# Patient Record
Sex: Female | Born: 1990 | Race: Black or African American | Hispanic: No | Marital: Single
Health system: Southern US, Community
[De-identification: ages and names within clinical notes are randomized; demographics above are authoritative.]

## PROBLEM LIST (undated history)

## (undated) DIAGNOSIS — G43909 Migraine, unspecified, not intractable, without status migrainosus: Secondary | ICD-10-CM

## (undated) DIAGNOSIS — F329 Major depressive disorder, single episode, unspecified: Secondary | ICD-10-CM

## (undated) DIAGNOSIS — F32A Depression, unspecified: Secondary | ICD-10-CM

## (undated) DIAGNOSIS — G35 Multiple sclerosis: Secondary | ICD-10-CM

## (undated) HISTORY — PX: NO PAST SURGERIES: SHX2092

## (undated) HISTORY — DX: Multiple sclerosis: G35

---
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
## 2012-01-24 ENCOUNTER — Encounter (HOSPITAL_COMMUNITY): Payer: Self-pay | Admitting: *Deleted

## 2012-01-24 ENCOUNTER — Emergency Department (HOSPITAL_COMMUNITY)
Admission: EM | Admit: 2012-01-24 | Discharge: 2012-01-25 | Disposition: A | Discharge status: Home / Self Care | Payer: Medicaid Other | Attending: Emergency Medicine | Admitting: Emergency Medicine

## 2012-01-24 DIAGNOSIS — F329 Major depressive disorder, single episode, unspecified: Secondary | ICD-10-CM | POA: Insufficient documentation

## 2012-01-24 DIAGNOSIS — F3289 Other specified depressive episodes: Secondary | ICD-10-CM | POA: Insufficient documentation

## 2012-01-24 DIAGNOSIS — E669 Obesity, unspecified: Secondary | ICD-10-CM | POA: Insufficient documentation

## 2012-01-24 HISTORY — DX: Migraine, unspecified, not intractable, without status migrainosus: G43.909

## 2012-01-24 HISTORY — DX: Depression, unspecified: F32.A

## 2012-01-24 HISTORY — DX: Major depressive disorder, single episode, unspecified: F32.9

## 2012-01-24 LAB — DIFFERENTIAL
Lymphocytes Relative: 17 % (ref 12–46)
Lymphs Abs: 2.2 10*3/uL (ref 0.7–4.0)
Monocytes Absolute: 0.5 10*3/uL (ref 0.1–1.0)
Monocytes Relative: 4 % (ref 3–12)
Neutro Abs: 9.5 10*3/uL — ABNORMAL HIGH (ref 1.7–7.7)
Neutrophils Relative %: 77 % (ref 43–77)

## 2012-01-24 LAB — CBC
HCT: 38.8 % (ref 36.0–46.0)
Hemoglobin: 13.1 g/dL (ref 12.0–15.0)
RBC: 4.51 MIL/uL (ref 3.87–5.11)

## 2012-01-24 LAB — BASIC METABOLIC PANEL
BUN: 7 mg/dL (ref 6–23)
CO2: 22 mEq/L (ref 19–32)
Chloride: 103 mEq/L (ref 96–112)
GFR calc Af Amer: >90 mL/min (ref >90)
Glucose, Bld: 198 mg/dL — ABNORMAL HIGH (ref 70–99)
Potassium: 3.3 mEq/L — ABNORMAL LOW (ref 3.5–5.1)

## 2012-01-24 LAB — URINALYSIS, ROUTINE W REFLEX MICROSCOPIC
Bilirubin Urine: NEGATIVE
Ketones, ur: NEGATIVE mg/dL
Leukocytes, UA: NEGATIVE
Nitrite: NEGATIVE
Specific Gravity, Urine: >1.03 — ABNORMAL HIGH (ref 1.005–1.030)
Urobilinogen, UA: 0.2 mg/dL (ref 0.0–1.0)
pH: 5.5 (ref 5.0–8.0)

## 2012-01-24 LAB — SALICYLATE LEVEL: Salicylate Lvl: 0 mg/dL — ABNORMAL LOW (ref 2.8–20.0)

## 2012-01-24 LAB — HEPATIC FUNCTION PANEL
ALT: 17 U/L (ref 0–35)
Albumin: 4 g/dL (ref 3.5–5.2)
Total Protein: 8.1 g/dL (ref 6.0–8.3)

## 2012-01-24 NOTE — ED Notes (Signed)
Pt ok w/ family coming back. Security wanded family before coming back to the er.

## 2012-01-24 NOTE — ED Notes (Signed)
Pt reports threating to take pills. Pt did have a suicide attempt in April of last year.
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
## 2012-01-24 NOTE — ED Notes (Signed)
Per EMS pt in argument w/ mother. Threatened to take some pills.

## 2012-01-24 NOTE — ED Provider Notes (Signed)
History    This chart was scribed for Jacqueline Octave, MD, MD by Jacqueline Erickson. The patient was seen in room APA16A and the patient's care was started at 10:48PM.   CSN: [id number]  Arrival date & time 01/24/12  2216   First MD Initiated Contact with Patient 01/24/12 2235      Chief Complaint  Patient presents with  . V70.1    (Consider location/radiation/quality/duration/timing/severity/associated sxs/prior treatment) The history is provided by the patient.   Jacqueline Erickson is a 21 y.o. female who presents to the Emergency Department due to mom thinking pt had taken ibuprofen today. Pt has argument with mom and threatened to take some pills but did not take any. Denies SI and HI. Mom called the police and was going to take out IVC papers out but pt decided to come to ED instead. Reports hx of depression. Denies any other illnesses and pain.   Past Medical History  Diagnosis Date  . Depression   . Migraine     History reviewed. No pertinent past surgical history.  History reviewed. No pertinent family history.  History  Substance Use Topics  . Smoking status: Never Smoker   . Smokeless tobacco: Not on file  . Alcohol Use: No    OB History    Grav Para Term Preterm Abortions TAB SAB Ect Mult Living                  Review of Systems  All other systems reviewed and are negative.  10 Systems reviewed and all are negative for acute change except as noted in the HPI.   Allergies  Histamine and Watermelon flavor  Home Medications   Current Outpatient Rx  Name Route Sig Dispense Refill  . ACETAMINOPHEN 500 MG PO TABS Oral Take 500 mg by mouth as needed. For pain    . ETONOGESTREL 68 MG [place] IMPL Subcutaneous Inject 1 each into the skin once.      BP 118/95  Pulse 115  Temp(Src) 99.8 F (37.7 C) (Oral)  Resp 20  Ht 5\' 2"  (1.575 m)  Wt 230 lb (104.327 kg)  BMI 42.07 kg/m2  SpO2 100%  LMP 12/11/2011  Physical Exam  Nursing note and vitals  reviewed. Constitutional: She is oriented to person, place, and time. She appears well-developed and well-nourished. No distress.       Tearful  obese  HENT:  Head: Normocephalic and atraumatic.  Eyes: Conjunctivae are normal. Pupils are equal, round, and reactive to light.  Cardiovascular: Normal rate and regular rhythm.   Pulmonary/Chest: Effort normal and breath sounds normal. No respiratory distress.  Abdominal: Soft. She exhibits no distension.  Neurological: She is alert and oriented to person, place, and time.  Skin: Skin is warm and dry.  Psychiatric: She has a normal mood and affect. Her behavior is normal.    ED Course  Procedures (including critical care time) DIAGNOSTIC STUDIES: Oxygen Saturation is 100% on room air, normal by my interpretation.    COORDINATION OF CARE: 10:51PM EDP discusses pt ED treatment with pt.    Labs Reviewed  URINALYSIS, ROUTINE W REFLEX MICROSCOPIC - Abnormal; Notable for the following:    Specific Gravity, Urine >1.030 (*)    Protein, ur TRACE (*)    All other components within normal limits  CBC - Abnormal; Notable for the following:    WBC 12.4 (*)    All other components within normal limits  DIFFERENTIAL - Abnormal; Notable for the following:  Neutro Abs 9.5 (*)    All other components within normal limits  BASIC METABOLIC PANEL - Abnormal; Notable for the following:    Potassium 3.3 (*)    Glucose, Bld 198 (*)    GFR calc non Af Amer 88 (*)    All other components within normal limits  SALICYLATE LEVEL - Abnormal; Notable for the following:    Salicylate Lvl 0.0 (*)    All other components within normal limits  PREGNANCY, URINE  ACETAMINOPHEN LEVEL  ETHANOL  HEPATIC FUNCTION PANEL  URINE RAPID DRUG SCREEN (HOSP PERFORMED)  URINE MICROSCOPIC-ADD ON   No results found.   1. Depression       MDM  Argument at home with mother. Patient threatened to take some pills including ibuprofen but states she did not. Still  feeling suicidal. Does not have plan to hurt herself. Does not want to hurt anyone else. Denies drug or alcohol abuse.  Screening labs, discuss with act team  Patient's mother has arrived as well as brother. Brother called the ambulance because he thought the patient took pills. Patient adamantly denies taking pills. She's no longer states she is suicidal. He denies any homicidal ideation or hallucinations. Patient admitted to behavioral health last year after intentional overdose. She regrets this and does not want to go back. No evidence of acute ingestion today.  She has contracted for safety with Jacqueline Erickson from act team. Her mother is comfortable taking her home. [facility]  I personally performed the services described in this documentation, which was scribed in my presence.  The recorded information has been reviewed and considered.        Jacqueline Octave, MD 01/25/12 [phone]

## 2012-01-25 LAB — RAPID URINE DRUG SCREEN, HOSP PERFORMED
Barbiturates: NOT DETECTED
Tetrahydrocannabinol: NOT DETECTED

## 2012-01-25 MED ORDER — ACETAMINOPHEN 325 MG PO TABS
650.0000 mg | ORAL_TABLET | Freq: Once | ORAL | Status: AC
  Administered 2012-01-25: 650 mg via ORAL
  Filled 2012-01-25: qty 2

## 2012-01-25 NOTE — Discharge Instructions (Signed)
Depression, Adolescent and Adult Follow up with [facility] as instructed.  Return to the ED if you develop new or worsening symptoms. Depression is a true and treatable medical condition. In general there are two kinds of depression:  Depression we all experience in some form. For example depression from the death of a loved one, financial distress or natural disasters will trigger or increase depression.   Clinical depression, on the other hand, appears without an apparent cause or reason. This depression is a disease. Depression may be caused by chemical imbalance in the body and brain or may come as a response to a physical illness. Alcohol and other drugs can cause depression.  DIAGNOSIS  The diagnosis of depression is usually based upon symptoms and medical history. TREATMENT  Treatments for depression fall into three categories. These are:  Drug therapy. There are many medicines that treat depression. Responses may vary and sometimes trial and error is necessary to determine the best medicines and dosage for a particular patient.   Psychotherapy, also called talking treatments, helps people resolve their problems by looking at them from a different point of view and by giving people insight into their own personal makeup. Traditional psychotherapy looks at a childhood source of a problem. Other psychotherapy will look at current conflicts and move toward solving those. If the cause of depression is drug use, counseling is available to help abstain. In time the depression will usually improve. If there were underlying causes for the chemical use, they can be addressed.   ECT (electroconvulsive therapy) or shock treatment is not as commonly used today. It is a very effective treatment for severe suicidal depression. During ECT electrical impulses are applied to the head. These impulses cause a generalized seizure. It can be effective but causes a loss of memory for recent events. Sometimes this  loss of memory may include the last several months.  Treat all depression or suicide threats as serious. Obtain professional help. Do not wait to see if serious depression will get better over time without help. Seek help for yourself or those around you. In the [place] the number to the National Suicide Help Lines With 24 Hour Help Are: 1-800-SUICIDE 507-439-4744 Document Released: 08/10/2000 Document Revised: 08/02/2011 Document Reviewed: 03/31/2008 [facility] Patient Information 2012 [place], [place].

## 2012-01-25 NOTE — ED Notes (Signed)
Pt alert & oriented x4, stable gait. Pt given discharge instructions, paperwork & prescription(s). Patient instructed to stop at the registration desk to finish any additional paperwork. pt verbalized understanding. Pt left department w/ no further questions.  
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
## 2013-04-30 ENCOUNTER — Emergency Department (INDEPENDENT_AMBULATORY_CARE_PROVIDER_SITE_OTHER)
Admission: EM | Admit: 2013-04-30 | Discharge: 2013-04-30 | Disposition: A | Discharge status: Home / Self Care | Payer: BC Managed Care – PPO | Source: Home / Self Care | Attending: Family Medicine | Admitting: Family Medicine

## 2013-04-30 ENCOUNTER — Encounter (HOSPITAL_COMMUNITY): Payer: Self-pay | Admitting: Emergency Medicine

## 2013-04-30 DIAGNOSIS — L089 Local infection of the skin and subcutaneous tissue, unspecified: Secondary | ICD-10-CM

## 2013-04-30 MED ORDER — DOXYCYCLINE HYCLATE 100 MG PO CAPS: 100.0000 mg | ORAL_CAPSULE | Freq: Two times a day (BID) | ORAL | Status: DC

## 2013-04-30 NOTE — ED Provider Notes (Signed)
Jacqueline Erickson is a 22 y.o. female who presents to Urgent Care today for her fourth fingernail injury. On Monday patient's right fourth fingernail is dorsally forced upwards. She notes pain and some discharge from underneath the nail currently. She describes the discharge is clear to yellow. She has pain with palpation of the digit but otherwise feels well. She denies any fevers chills nausea vomiting diarrhea trouble breathing. She has tried soaking it in peroxide and using antibiotic ointment which has helped a bit. She feels well otherwise   Past Medical History  Diagnosis Date  . Depression   . Migraine    History  Substance Use Topics  . Smoking status: Never Smoker   . Smokeless tobacco: Not on file  . Alcohol Use: No   ROS as above Medications reviewed. No current facility-administered medications for this encounter.   Current Outpatient Prescriptions  Medication Sig Dispense Refill  . acetaminophen (TYLENOL) 500 MG tablet Take 500 mg by mouth as needed. For pain      . doxycycline (VIBRAMYCIN) 100 MG capsule Take 1 capsule (100 mg total) by mouth 2 (two) times daily.  14 capsule  0  . etonogestrel (NEXPLANON) 68 MG IMPL implant Inject 1 each into the skin once.        Exam:  BP 136/85  Pulse 98  Temp(Src) 99.4 F (37.4 C) (Oral)  Resp 20  SpO2 100%  LMP 04/12/2013 Gen: Well NAD HAND: Right fourth fingernail. Artificial nail applied to the natural fingernail. The natural nail is dorsally displaced mildly. There is some clear discharge present. Mildly tender to touch. No surrounding erythema. Sensation is intact as is capillary refill distally.   No results found for this or any previous visit (from the past 24 hour(s)). No results found.  Assessment and Plan: 22 y.o. female with possible infection of a slightly displaced fingernail.  Plan to treat with warm water soaks and doxycycline.  Watchful waiting Followup as needed Discussed warning signs or symptoms.  Please see discharge instructions. Patient expresses understanding.      Rodolph Bong, MD 04/30/13 [phone]

## 2013-04-30 NOTE — ED Notes (Signed)
C/o right ring finger injury due to snag the finger on bed rail on Monday.   Patient did soak finger in peroxide and used triple antibiotic ointment.  Yellowish cloudy Pus started today.

## 2013-06-04 ENCOUNTER — Emergency Department (HOSPITAL_COMMUNITY)
Admission: EM | Admit: 2013-06-04 | Discharge: 2013-06-04 | Disposition: A | Discharge status: Home / Self Care | Payer: BC Managed Care – PPO | Attending: Emergency Medicine | Admitting: Emergency Medicine

## 2013-06-04 ENCOUNTER — Encounter (HOSPITAL_COMMUNITY): Payer: Self-pay | Admitting: Emergency Medicine

## 2013-06-04 ENCOUNTER — Emergency Department (HOSPITAL_COMMUNITY): Payer: BC Managed Care – PPO

## 2013-06-04 DIAGNOSIS — Z8659 Personal history of other mental and behavioral disorders: Secondary | ICD-10-CM | POA: Insufficient documentation

## 2013-06-04 DIAGNOSIS — Z8679 Personal history of other diseases of the circulatory system: Secondary | ICD-10-CM | POA: Insufficient documentation

## 2013-06-04 DIAGNOSIS — M25562 Pain in left knee: Secondary | ICD-10-CM

## 2013-06-04 DIAGNOSIS — Z79899 Other long term (current) drug therapy: Secondary | ICD-10-CM | POA: Insufficient documentation

## 2013-06-04 DIAGNOSIS — M25569 Pain in unspecified knee: Secondary | ICD-10-CM | POA: Insufficient documentation

## 2013-06-04 NOTE — ED Notes (Signed)
Patient transported to X-ray 

## 2013-06-04 NOTE — ED Notes (Signed)
Pt. Reports left knee pain for 2 weeks denies injury or fall ,ambulatory.

## 2013-06-04 NOTE — ED Provider Notes (Signed)
CSN: [id number]     Arrival date & time 06/04/13  1709 History  This chart was scribed for non-physician practitioner Coral Ceo, PA-C, working with Richardean Canal, MD by Dorothey Baseman, ED Scribe. This patient was seen in room TR08C/TR08C and the patient's care was started at 7:11 PM.    Chief Complaint  Patient presents with  . Knee Pain   The history is provided by the patient. No language interpreter was used.   HPI Comments: Jacqueline Erickson is a 22 y.o. Female with a PMH of migraine and depression who presents to the Emergency Department complaining of knee pain.  Patient states she has had left knee pain for the past 2 weeks. She states that her pain is intermittent and is described as an aching sensation. She states that her pain is worse when she wakes up in the morning and after standing or sitting for long periods of time. She states that her pain is also worse when she walks. She denies any injuries, trauma, or falls. Patient states that she took naproxen at home which were early relieves her pain however it returns. He denies any similar knee pain or previous knee surgeries in the past. Patient states that she recently began a new job where she standing for several hours at a time. Denies any knee swelling, erythema, ecchymosis, or lacerations. She denies any numbness, tingling, loss of sensation, or weakness.  He otherwise has been well with no fever, chills, change in appetite or activity, chest pain, shortness of breath, abdominal pain, nausea, vomiting, dysuria, leg edema, calf tenderness or swelling. She denies any history of DVT or clotting in the past. She denies any recent immobilization, recent travel, history of cancer.    Past Medical History  Diagnosis Date  . Depression   . Migraine    History reviewed. No pertinent past surgical history. No family history on file. History  Substance Use Topics  . Smoking status: Never Smoker   . Smokeless tobacco: Not on file  .  Alcohol Use: No   OB History   Grav Para Term Preterm Abortions TAB SAB Ect Mult Living                 Review of Systems  Constitutional: Negative for fever, chills, diaphoresis, activity change, appetite change and fatigue.  HENT: Negative for congestion and sore throat.   Eyes: Negative for visual disturbance.  Respiratory: Negative for cough and shortness of breath.   Cardiovascular: Negative for chest pain and leg swelling.  Gastrointestinal: Negative for nausea, vomiting, abdominal pain, diarrhea and constipation.  Genitourinary: Negative for dysuria.  Musculoskeletal: Positive for arthralgias. Negative for back pain, gait problem, joint swelling and myalgias.  Skin: Negative for color change and wound.  Neurological: Negative for dizziness, weakness, light-headedness, numbness and headaches.    Allergies  Garlic powder; Histamine; and Watermelon flavor  Home Medications   Current Outpatient Rx  Name  Route  Sig  Dispense  Refill  . naproxen (NAPROSYN) 500 MG tablet   Oral   Take 500 mg by mouth 2 (two) times daily as needed (pain).         Marland Kitchen etonogestrel (NEXPLANON) 68 MG IMPL implant   Subcutaneous   Inject 1 each into the skin once.          Triage Vitals: BP 156/87  Pulse 78  Temp(Src) 98.6 F (37 C) (Oral)  Resp 18  Wt 240 lb (108.863 kg)  BMI 43.89 kg/m2  SpO2 98%  Filed Vitals:   06/04/13 1718 06/04/13 2031  BP: 156/87 129/92  Pulse: 78 84  Temp: 98.6 F (37 C)   TempSrc: Oral   Resp: 18 15  Weight: 240 lb (108.863 kg)   SpO2: 98% 100%     Physical Exam  Nursing note and vitals reviewed. Constitutional: She is oriented to person, place, and time. She appears well-developed and well-nourished. No distress.  HENT:  Head: Normocephalic and atraumatic.  Eyes: Conjunctivae are normal.  Neck: Normal range of motion. Neck supple.  Cardiovascular: Normal rate, regular rhythm, normal heart sounds and intact distal pulses.  Exam reveals no  gallop and no friction rub.   No murmur heard. Dorsalis pedis pulses present bilaterally  Pulmonary/Chest: Effort normal and breath sounds normal. No respiratory distress. She has no wheezes. She has no rales. She exhibits no tenderness.  Abdominal: Soft. She exhibits no distension. There is no tenderness.  Musculoskeletal: Normal range of motion. She exhibits tenderness. She exhibits no edema.  Tenderness to palpation to the left anterior inferior joint lines of the patella as well as supra-patellar tenderness. Increased pain with flexion and extension of the left knee no limitations of range of motion. There is no evidence of edema or erythema to the knees bilaterally. Patella is without tenderness or mobility. No pedal edema or calf tenderness bilaterally. No left ankle or foot tenderness to palpation. No left hip tenderness to palpation. Patient able to ambulate without difficulty or ataxia. Patient able to flex and extend left hip without difficulty or limitations. Strength 5 out of 5 in lower extremity bilaterally  Neurological: She is alert and oriented to person, place, and time.  Gross sensation intact in the lower extremities. Patellar reflexes intact bilaterally  Skin: Skin is warm and dry. She is not diaphoretic. No erythema.     No ecchymosis, edema, erythema, or lacerations to the left knee throughout  Psychiatric: She has a normal mood and affect. Her behavior is normal.    ED Course  Procedures (including critical care time)  DIAGNOSTIC STUDIES: Oxygen Saturation is 98% on room air, normal by my interpretation.    COORDINATION OF CARE: 7:15PM- Discussed negative x-ray results. Discussed that suspicion for infection is low and that symptoms are likely musculoskeletal in nature. Advised patient to continue taking Naproxen at home, to apply cold compresses to the area, and to stretch the surrounding muscles. Advised patient to follow up with a PCP or orthopaedist, especially if  there are any new or worsening symptoms. Will discharge patient with a knee brace at her request. Discussed treatment plan with patient at bedside and patient verbalized agreement.     Labs Review Labs Reviewed - No data to display  Imaging Review Dg Knee Complete 4 Views Left  06/04/2013   CLINICAL DATA:  Two week history of left knee pain  EXAM: LEFT KNEE - COMPLETE 4+ VIEW  COMPARISON:  None.  FINDINGS: There is no evidence of fracture, dislocation, or joint effusion. There is no evidence of arthropathy or other focal bone abnormality. Soft tissues are unremarkable.  IMPRESSION: Negative.   Electronically Signed   By: Malachy Moan M.D.   On: 06/04/2013 18:40    EKG Interpretation   None       DG Knee Complete 4 Views Left (Final result)  Result time: 06/04/13 18:40:35    Final result by Rad Results In Interface (06/04/13 18:40:35)    Narrative:   CLINICAL DATA: Two week history of left  knee pain  EXAM: LEFT KNEE - COMPLETE 4+ VIEW  COMPARISON: None.  FINDINGS: There is no evidence of fracture, dislocation, or joint effusion. There is no evidence of arthropathy or other focal bone abnormality. Soft tissues are unremarkable.  IMPRESSION: Negative.   Electronically Signed By: Malachy Moan M.D. On: 06/04/2013 18:40         MDM   1. Left knee pain     Jacqueline Erickson is a 22 y.o. Female with a PMH of migraine and depression who presents to the Emergency Department complaining of knee pain.  Left knee x-rays ordered.    Etiology of left knee pain is unclear however is likely musculoskeletal in nature.  Patient x-rays were negative for fracture or dislocation. She had no signs of infection including edema, erythema, or fever.  Patient was neurovascularly intact no limitations of range of motion.  Wells score negative. PERC negative. No leg edema or calf tenderness.  She was given a knee sleeve to help with symptomatic relief. She was also instructed to  rest, elevate, and apply cold compresses for relief.  She was instructed to followup with her primary care provider if her symptoms are not improving or worsening. He was also given referral to orthopedics to make an appointment if her symptoms are not improving or worsening. Patient was instructed to return to the ED if they experience any loss of sensation, weakness, joint swelling or redness, fever, unilateral leg swelling or redness, or other concerns. Patient was in agreement with discharge and plan.     Final impressions: 1. Left knee pain      Jacqueline Erickson   I personally performed the services described in this documentation, which was scribed in my presence. The recorded information has been reviewed and is accurate.     Jillyn Ledger, PA-C 06/05/13 [phone]

## 2013-06-05 NOTE — ED Provider Notes (Signed)
Medical screening examination/treatment/procedure(s) were performed by non-physician practitioner and as supervising physician I was immediately available for consultation/collaboration.   David H Yao, MD 06/05/13 1328 

## 2013-06-19 ENCOUNTER — Ambulatory Visit (INDEPENDENT_AMBULATORY_CARE_PROVIDER_SITE_OTHER): Payer: BC Managed Care – PPO | Admitting: Obstetrics & Gynecology

## 2013-06-19 ENCOUNTER — Encounter: Payer: Medicaid Other | Admitting: Obstetrics & Gynecology

## 2013-06-19 ENCOUNTER — Encounter: Payer: Self-pay | Admitting: Obstetrics & Gynecology

## 2013-06-19 VITALS — BP 120/80 | HR 89 | Temp 97.6°F | Ht 61.5 in | Wt 238.1 lb

## 2013-06-19 DIAGNOSIS — N898 Other specified noninflammatory disorders of vagina: Secondary | ICD-10-CM

## 2013-06-19 DIAGNOSIS — Z01419 Encounter for gynecological examination (general) (routine) without abnormal findings: Secondary | ICD-10-CM

## 2013-06-19 NOTE — Progress Notes (Signed)
Patient ID: Jacqueline Erickson, female   DOB: [birth year], 22 y.o.   MRN: [id number] Subjective:     Jacqueline Erickson is a 22 y.o. female here for a routine exam.  Pt has had Nexplanon for 2 year with no problems. H/o trich 3 years prev. Monogamous for 2 years. She does report a thick discharge but, no itching or odor.  Gynecologic History Patient's last menstrual period was 04/11/2013. Contraception: Nexplanon  Obstetric History OB History  Gravida Para Term Preterm AB SAB TAB Ectopic Multiple Living  0 0 0 0 0 0 0 0 0 0          The following portions of the patient's history were reviewed and updated as appropriate: allergies, current medications, past family history, past medical history, past social history, past surgical history and problem list.  Review of Systems Pertinent items are noted in HPI.    Objective:    BP 120/80  Pulse 89  Temp(Src) 97.6 F (36.4 C)  Ht 5' 1.5" (1.562 m)  Wt 238 lb 1.6 oz (108.001 kg)  BMI 44.27 kg/m2  LMP 04/11/2013  General Appearance:    Alert, cooperative, no distress, appears stated age              Throat:   Lips, mucosa, and tongue normal; teeth and gums normal  Neck:   Supple, symmetrical, trachea midline, no adenopathy;    thyroid:  no enlargement/tenderness/nodules; no carotid   bruit or JVD  Back:     Symmetric, no curvature, ROM normal, no CVA tenderness  Lungs:     Clear to auscultation bilaterally, respirations unlabored  Chest Wall:    No tenderness or deformity   Heart:    Regular rate and rhythm, S1 and S2 normal, no murmur, rub   or gallop  Breast Exam:    No tenderness, masses, or nipple abnormality  Abdomen:     Soft, non-tender, bowel sounds active all four quadrants,    no masses, no organomegaly  Genitalia:    Normal female without lesion or tenderness; there is a thick white discharge small amount     Extremities:   Extremities normal, atraumatic, no cyanosis or edema  Pulses:   2+ and symmetric all  extremities  Skin:   Skin color, texture, turgor normal, no rashes or lesions  Lymph nodes:   Cervical, supraclavicular, and axillary nodes normal         Assessment:    Healthy female exam.  Vaginal discharge- only min sx   Plan:    Follow up in: 1 year.   F/u [facility] [place]  F/u cx

## 2013-06-19 NOTE — Patient Instructions (Signed)
Sexually Transmitted Disease  Sexually transmitted disease (STD) refers to any infection that is passed from person to person during sexual activity. This may happen by way of saliva, semen, blood, vaginal mucus, or urine. Common STDs include:   Gonorrhea.   Chlamydia.   Syphilis.   HIV/AIDS.   Genital herpes.   Hepatitis B and C.   Trichomonas.   Human papillomavirus (HPV).   Pubic lice.  CAUSES   An STD may be spread by bacteria, virus, or parasite. A person can get an STD by:   Sexual intercourse with an infected person.   Sharing sex toys with an infected person.   Sharing needles with an infected person.   Having intimate contact with the genitals, mouth, or rectal areas of an infected person.  SYMPTOMS   Some people may not have any symptoms, but they can still pass the infection to others. Different STDs have different symptoms. Symptoms include:   Painful or bloody urination.   Pain in the pelvis, abdomen, vagina, anus, throat, or eyes.   Skin rash, itching, irritation, growths, or sores (lesions). These usually occur in the genital or anal area.   Abnormal vaginal discharge.   Penile discharge in men.   Soft, flesh-colored skin growths in the genital or anal area.   Fever.   Pain or bleeding during sexual intercourse.   Swollen glands in the groin area.   Yellow skin and eyes (jaundice). This is seen with hepatitis.  DIAGNOSIS   To make a diagnosis, your caregiver may:   Take a medical history.   Perform a physical exam.   Take a specimen (culture) to be examined.   Examine a sample of discharge under a microscope.   Perform blood tests.   Perform a Pap test, if this applies.   Perform a colposcopy.   Perform a laparoscopy.  TREATMENT    Chlamydia, gonorrhea, trichomonas, and syphilis can be cured with antibiotic medicine.   Genital herpes, hepatitis, and HIV can be treated, but not cured, with prescribed medicines. The medicines will lessen the symptoms.   Genital warts  from HPV can be treated with medicine or by freezing, burning (electrocautery), or surgery. Warts may come back.   HPV is a virus and cannot be cured with medicine or surgery.However, abnormal areas may be followed very closely by your caregiver and may be removed from the cervix, vagina, or vulva through office procedures or surgery.  If your diagnosis is confirmed, your recent sexual partners need treatment. This is true even if they are symptom-free or have a negative culture or evaluation. They should not have sex until their caregiver says it is okay.  HOME CARE INSTRUCTIONS   All sexual partners should be informed, tested, and treated for all STDs.   Take your antibiotics as directed. Finish them even if you start to feel better.   Only take over-the-counter or prescription medicines for pain, discomfort, or fever as directed by your caregiver.   Rest.   Eat a balanced diet and drink enough fluids to keep your urine clear or pale yellow.   Do not have sex until treatment is completed and you have followed up with your caregiver. STDs should be checked after treatment.   Keep all follow-up appointments, Pap tests, and blood tests as directed by your caregiver.   Only use latex condoms and water-soluble lubricants during sexual activity. Do not use petroleum jelly or oils.   Avoid alcohol and illegal drugs.   Get vaccinated   for HPV and hepatitis. If you have not received these vaccines in the past, talk to your caregiver about whether one or both might be right for you.   Avoid risky sex practices that can break the skin.  The only way to avoid getting an STD is to avoid all sexual activity.Latex condoms and dental dams (for oral sex) will help lessen the risk of getting an STD, but will not completely eliminate the risk.  SEEK MEDICAL CARE IF:    You have a fever.   You have any new or worsening symptoms.  Document Released: 11/03/2002 Document Revised: 11/05/2011 Document Reviewed:  11/10/2010  [employer] Patient Information 2014 [employer].

## 2013-06-19 NOTE — Addendum Note (Signed)
Addended by: Franchot Mimes on: 06/19/2013 12:14 PM   Modules accepted: Orders

## 2013-06-20 LAB — WET PREP, GENITAL
Trich, Wet Prep: NONE SEEN
Yeast Wet Prep HPF POC: NONE SEEN

## 2013-07-10 ENCOUNTER — Ambulatory Visit (INDEPENDENT_AMBULATORY_CARE_PROVIDER_SITE_OTHER): Payer: BC Managed Care – PPO | Admitting: Obstetrics & Gynecology

## 2013-07-10 ENCOUNTER — Encounter: Payer: Self-pay | Admitting: Obstetrics & Gynecology

## 2013-07-10 VITALS — BP 113/80 | HR 87 | Temp 99.0°F | Ht 62.0 in | Wt 234.8 lb

## 2013-07-10 DIAGNOSIS — N760 Acute vaginitis: Secondary | ICD-10-CM

## 2013-07-10 MED ORDER — FLUCONAZOLE 150 MG PO TABS: 150.0000 mg | ORAL_TABLET | Freq: Once | ORAL | Status: DC

## 2013-07-10 NOTE — Progress Notes (Signed)
Subjective:     Patient ID: Jacqueline Erickson, female   DOB: 1991-04-25, 21 y.o.   MRN: [id number]  HPI Pt reports that she started having itching and a brown discharge so she took some meds at  Home that her mother had (she called and it was Metrogel).  It has not helped with the sx.    Review of Systems     Objective:   Physical Exam BP 113/80  Pulse 87  Temp(Src) 99 F (37.2 C)  Ht 5\' 2"  (1.575 m)  Wt 234 lb 12.8 oz (106.505 kg)  BMI 42.93 kg/m2  LMP 04/11/2013 GU: EGBUS: no lesions Vagina: no blood in vault; thick white curd like discharge Cervix: no lesion; no mucopurulent d/c      Assessment:     Vaginitis- suspect yeast     Plan:     F/u wet smear and KOH  Diflucan 150mg  q day x 1 F/u prn

## 2013-07-10 NOTE — Progress Notes (Signed)
Patient reports vaginal irritation with brownish discharge x1 week. Took a medicine that her mom had and irritation was relieved. Now discharge is coming back.

## 2013-07-11 LAB — WET PREP, GENITAL

## 2014-12-16 ENCOUNTER — Ambulatory Visit: Payer: Self-pay | Admitting: Obstetrics & Gynecology

## 2015-01-09 ENCOUNTER — Encounter (HOSPITAL_COMMUNITY): Payer: Self-pay | Admitting: Emergency Medicine

## 2015-01-09 ENCOUNTER — Emergency Department (INDEPENDENT_AMBULATORY_CARE_PROVIDER_SITE_OTHER)
Admission: EM | Admit: 2015-01-09 | Discharge: 2015-01-09 | Disposition: A | Discharge status: Home / Self Care | Payer: Self-pay | Source: Home / Self Care | Attending: Emergency Medicine | Admitting: Emergency Medicine

## 2015-01-09 DIAGNOSIS — M5442 Lumbago with sciatica, left side: Secondary | ICD-10-CM

## 2015-01-09 MED ORDER — IBUPROFEN 800 MG PO TABS: 800.0000 mg | ORAL_TABLET | Freq: Three times a day (TID) | ORAL | Status: DC

## 2015-01-09 MED ORDER — CYCLOBENZAPRINE HCL 10 MG PO TABS: 10.0000 mg | ORAL_TABLET | Freq: Three times a day (TID) | ORAL | Status: DC | PRN
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
## 2015-04-25 ENCOUNTER — Encounter (HOSPITAL_COMMUNITY): Payer: Self-pay | Admitting: Emergency Medicine

## 2015-04-25 ENCOUNTER — Emergency Department (INDEPENDENT_AMBULATORY_CARE_PROVIDER_SITE_OTHER)
Admission: EM | Admit: 2015-04-25 | Discharge: 2015-04-25 | Disposition: A | Discharge status: Home / Self Care | Payer: BLUE CROSS/BLUE SHIELD | Source: Home / Self Care | Attending: Family Medicine | Admitting: Family Medicine

## 2015-04-25 DIAGNOSIS — K2970 Gastritis, unspecified, without bleeding: Secondary | ICD-10-CM | POA: Diagnosis not present

## 2015-04-25 DIAGNOSIS — R1013 Epigastric pain: Secondary | ICD-10-CM

## 2015-04-25 DIAGNOSIS — G441 Vascular headache, not elsewhere classified: Secondary | ICD-10-CM | POA: Diagnosis not present

## 2015-04-25 MED ORDER — SUCRALFATE 1 G PO TABS: 1.0000 g | ORAL_TABLET | Freq: Three times a day (TID) | ORAL | Status: DC

## 2015-04-25 MED ORDER — ONDANSETRON HCL 4 MG PO TABS: 4.0000 mg | ORAL_TABLET | Freq: Four times a day (QID) | ORAL | Status: DC

## 2015-04-25 NOTE — Discharge Instructions (Signed)
Gastritis, Adult Zantac 150 mg twice a day Omeprazole 20 mg 1 or 2 times a day Carafate 1 gm before meals and bedtime. These tablets dissolve quickly in the mouth, so is easier to swallow. Try Lactaid for possible lactose intolerance zofran for nausea Gastritis is soreness and swelling (inflammation) of the lining of the stomach. Gastritis can develop as a sudden onset (acute) or long-term (chronic) condition. If gastritis is not treated, it can lead to stomach bleeding and ulcers. CAUSES  Gastritis occurs when the stomach lining is weak or damaged. Digestive juices from the stomach then inflame the weakened stomach lining. The stomach lining may be weak or damaged due to viral or bacterial infections. One common bacterial infection is the Helicobacter pylori infection. Gastritis can also result from excessive alcohol consumption, taking certain medicines, or having too much acid in the stomach.  SYMPTOMS  In some cases, there are no symptoms. When symptoms are present, they may include:  Pain or a burning sensation in the upper abdomen.  Nausea.  Vomiting.  An uncomfortable feeling of fullness after eating. DIAGNOSIS  Your caregiver may suspect you have gastritis based on your symptoms and a physical exam. To determine the cause of your gastritis, your caregiver may perform the following:  Blood or stool tests to check for the H pylori bacterium.  Gastroscopy. A thin, flexible tube (endoscope) is passed down the esophagus and into the stomach. The endoscope has a light and camera on the end. Your caregiver uses the endoscope to view the inside of the stomach.  Taking a tissue sample (biopsy) from the stomach to examine under a microscope. TREATMENT  Depending on the cause of your gastritis, medicines may be prescribed. If you have a bacterial infection, such as an H pylori infection, antibiotics may be given. If your gastritis is caused by too much acid in the stomach, H2 blockers or  antacids may be given. Your caregiver may recommend that you stop taking aspirin, ibuprofen, or other nonsteroidal anti-inflammatory drugs (NSAIDs). HOME CARE INSTRUCTIONS  Only take over-the-counter or prescription medicines as directed by your caregiver.  If you were given antibiotic medicines, take them as directed. Finish them even if you start to feel better.  Drink enough fluids to keep your urine clear or pale yellow.  Avoid foods and drinks that make your symptoms worse, such as:  Caffeine or alcoholic drinks.  Chocolate.  Peppermint or mint flavorings.  Garlic and onions.  Spicy foods.  Citrus fruits, such as oranges, lemons, or limes.  Tomato-based foods such as sauce, chili, salsa, and pizza.  Fried and fatty foods.  Eat small, frequent meals instead of large meals. SEEK IMMEDIATE MEDICAL CARE IF:   You have black or dark red stools.  You vomit blood or material that looks like coffee grounds.  You are unable to keep fluids down.  Your abdominal pain gets worse.  You have a fever.  You do not feel better after 1 week.  You have any other questions or concerns. MAKE SURE YOU:  Understand these instructions.  Will watch your condition.  Will get help right away if you are not doing well or get worse. Document Released: 08/07/2001 Document Revised: 02/12/2012 Document Reviewed: 09/26/2011 [facility] Patient Information 2015 [place], [place]. This information is not intended to replace advice given to you by your health care provider. Make sure you discuss any questions you have with your health care provider.  Abdominal Pain Many things can cause abdominal pain. Usually, abdominal pain  is not caused by a disease and will improve without treatment. It can often be observed and treated at home. Your health care provider will do a physical exam and possibly order blood tests and X-rays to help determine the seriousness of your pain. However, in many cases, more  time must pass before a clear cause of the pain can be found. Before that point, your health care provider may not know if you need more testing or further treatment. HOME CARE INSTRUCTIONS  Monitor your abdominal pain for any changes. The following actions may help to alleviate any discomfort you are experiencing:  Only take over-the-counter or prescription medicines as directed by your health care provider.  Do not take laxatives unless directed to do so by your health care provider.  Try a clear liquid diet (broth, tea, or water) as directed by your health care provider. Slowly move to a bland diet as tolerated. SEEK MEDICAL CARE IF:  You have unexplained abdominal pain.  You have abdominal pain associated with nausea or diarrhea.  You have pain when you urinate or have a bowel movement.  You experience abdominal pain that wakes you in the night.  You have abdominal pain that is worsened or improved by eating food.  You have abdominal pain that is worsened with eating fatty foods.  You have a fever. SEEK IMMEDIATE MEDICAL CARE IF:   Your pain does not go away within 2 hours.  You keep throwing up (vomiting).  Your pain is felt only in portions of the abdomen, such as the right side or the left lower portion of the abdomen.  You pass bloody or black tarry stools. MAKE SURE YOU:  Understand these instructions.   Will watch your condition.   Will get help right away if you are not doing well or get worse.  Document Released: 05/23/2005 Document Revised: 08/18/2013 Document Reviewed: 04/22/2013 [facility] Patient Information 2015 [place], [place]. This information is not intended to replace advice given to you by your health care provider. Make sure you discuss any questions you have with your health care provider.

## 2015-04-25 NOTE — ED Notes (Signed)
C/o constant epigastric pain onset 1 week associated w/HA, nauseas and diarrhea Pain increases w/"Dair food" Denies emesis, fevers, urinary sx Alert... No acute distress.

## 2015-04-25 NOTE — ED Provider Notes (Signed)
CSN: [id number]     Arrival date & time 04/25/15  1828 History   First MD Initiated Contact with Patient 04/25/15 1951     Chief Complaint  Patient presents with  . Abdominal Pain   (Consider location/radiation/quality/duration/timing/severity/associated sxs/prior Treatment) HPI Comments: 24 year old female presents with a history of epigastric pain for one week. It has become worse in the past 2-3 days. She has a history of migraine headaches in the epigastric pain have been triggering those. She is also had some loose stools. He epigastric pain is worse with dairy products. She occasionally has belching and reflux. She does have a more chronic history of occasional intermittent mild epigastric discomfort which has resolved on its own. She took ibuprofen and Pepto-Bismol without relief.  Patient is a 24 y.o. female presenting with abdominal pain.  Abdominal Pain Pain location:  Epigastric Pain quality: bloating, gnawing and sharp   Pain radiates to:  LUQ and RUQ Pain severity:  Moderate Onset quality:  Gradual Duration:  1 week Timing:  Intermittent Progression:  Waxing and waning Chronicity:  New Context: diet changes and suspicious food intake   Relieved by:  Nothing Worsened by:  Eating and palpation Ineffective treatments:  OTC medications Associated symptoms: belching and nausea   Associated symptoms: no chest pain, no constipation, no cough, no fatigue, no fever, no hematuria, no shortness of breath, no sore throat, no vaginal bleeding and no vomiting     Past Medical History  Diagnosis Date  . Depression   . Migraine    History reviewed. No pertinent past surgical history. Family History  Problem Relation Age of Onset  . Diabetes Mother   . Hypertension Mother   . Diabetes Father   . Hypertension Father   . Hyperlipidemia Father    Social History  Substance Use Topics  . Smoking status: Never Smoker   . Smokeless tobacco: Never Used  . Alcohol Use: No   OB  History    Gravida Para Term Preterm AB TAB SAB Ectopic Multiple Living       Review of Systems  Constitutional: Positive for activity change. Negative for fever and fatigue.  HENT: Negative.  Negative for sore throat.   Eyes: Negative.   Respiratory: Negative for cough and shortness of breath.   Cardiovascular: Negative for chest pain and palpitations.  Gastrointestinal: Positive for nausea and abdominal pain. Negative for vomiting, constipation, blood in stool and abdominal distention.  Genitourinary: Negative.  Negative for hematuria and vaginal bleeding.  Musculoskeletal: Negative.   Skin: Negative.   Neurological: Negative.     Allergies  Garlic powder; Histamine; and Watermelon flavor  Home Medications   Prior to Admission medications   Medication Sig Start Date End Date Taking? Authorizing Provider  etonogestrel (NEXPLANON) 68 MG IMPL implant Inject 1 each into the skin once.   Yes Historical Provider, MD  cyclobenzaprine (FLEXERIL) 10 MG tablet Take 1 tablet (10 mg total) by mouth 3 (three) times daily as needed for muscle spasms. 01/09/15   Charm Rings, MD  ibuprofen (ADVIL,MOTRIN) 800 MG tablet Take 1 tablet (800 mg total) by mouth 3 (three) times daily. For 1 week, then as needed 01/09/15   Charm Rings, MD  ondansetron (ZOFRAN) 4 MG tablet Take 1 tablet (4 mg total) by mouth every 6 (six) hours. 04/25/15   Hayden Rasmussen, NP  sucralfate (CARAFATE) 1 G tablet Take 1 tablet (1 g total) by mouth 4 (  four) times daily -  with meals and at bedtime. 04/25/15   Hayden Rasmussen, NP   Meds Ordered and Administered this Visit  Medications - No data to display  BP 131/85 mmHg  Pulse 70  Temp(Src) 99 F (37.2 C) (Oral)  Resp 16  SpO2 99%  LMP 04/08/2015 No data found.   Physical Exam  Constitutional: She is oriented to person, place, and time. She appears well-developed and well-nourished. No distress.  Cardiovascular: Normal rate and regular rhythm.    Pulmonary/Chest: Effort normal and breath sounds normal. No respiratory distress.  Abdominal: Soft. Bowel sounds are normal. She exhibits no distension. There is no rebound and no guarding.  Tenderness to the epigastrium. There is no pain in the lower abdomen but there is minor tenderness in the right lower quadrant. No rebound or guarding.  Musculoskeletal: She exhibits no edema.  Neurological: She is alert and oriented to person, place, and time.  Skin: Skin is warm and dry.  Psychiatric: She has a normal mood and affect.  Nursing note and vitals reviewed.   ED Course  Procedures (including critical care time)  Labs Review Labs Reviewed - No data to display  Imaging Review No results found.   Visual Acuity Review  Right Eye Distance:   Left Eye Distance:   Bilateral Distance:    Right Eye Near:   Left Eye Near:    Bilateral Near:         MDM   1. Epigastric pain   2. Gastritis   3. Other vascular headache    For pain in the RLQ, vomiting, fever or other sx's, or worsening of pain go to the ED immediately. Zantac 150 mg twice a day Omeprazole 20 mg 1 or 2 times a day Carafate 1 gm before meals and bedtime. These tablets dissolve quickly in the mouth, so is easier to swallow. Try Lactaid for possible lactose intolerance zofran for nausea   Hayden Rasmussen, NP 04/25/15 2039

## 2015-04-26 ENCOUNTER — Encounter (HOSPITAL_COMMUNITY): Payer: Self-pay | Admitting: Emergency Medicine

## 2015-04-26 ENCOUNTER — Emergency Department (HOSPITAL_COMMUNITY)
Admission: EM | Admit: 2015-04-26 | Discharge: 2015-04-26 | Disposition: A | Discharge status: Home / Self Care | Payer: BLUE CROSS/BLUE SHIELD | Attending: Emergency Medicine | Admitting: Emergency Medicine

## 2015-04-26 ENCOUNTER — Emergency Department (HOSPITAL_COMMUNITY): Payer: BLUE CROSS/BLUE SHIELD

## 2015-04-26 DIAGNOSIS — Z79899 Other long term (current) drug therapy: Secondary | ICD-10-CM | POA: Insufficient documentation

## 2015-04-26 DIAGNOSIS — Z8659 Personal history of other mental and behavioral disorders: Secondary | ICD-10-CM | POA: Insufficient documentation

## 2015-04-26 DIAGNOSIS — Z8679 Personal history of other diseases of the circulatory system: Secondary | ICD-10-CM | POA: Insufficient documentation

## 2015-04-26 DIAGNOSIS — Z793 Long term (current) use of hormonal contraceptives: Secondary | ICD-10-CM | POA: Insufficient documentation

## 2015-04-26 DIAGNOSIS — R197 Diarrhea, unspecified: Secondary | ICD-10-CM | POA: Insufficient documentation

## 2015-04-26 DIAGNOSIS — Z8719 Personal history of other diseases of the digestive system: Secondary | ICD-10-CM | POA: Insufficient documentation

## 2015-04-26 DIAGNOSIS — R1011 Right upper quadrant pain: Secondary | ICD-10-CM | POA: Insufficient documentation

## 2015-04-26 DIAGNOSIS — R112 Nausea with vomiting, unspecified: Secondary | ICD-10-CM | POA: Insufficient documentation

## 2015-04-26 LAB — COMPREHENSIVE METABOLIC PANEL
ALBUMIN: 3.8 g/dL (ref 3.5–5.0)
ALK PHOS: 50 U/L (ref 38–126)
ALT: 10 U/L — AB (ref 14–54)
AST: 13 U/L — AB (ref 15–41)
Anion gap: 8 (ref 5–15)
BUN: 9 mg/dL (ref 6–20)
CHLORIDE: 110 mmol/L (ref 101–111)
CO2: 22 mmol/L (ref 22–32)
CREATININE: 0.79 mg/dL (ref 0.44–1.00)
Calcium: 9.6 mg/dL (ref 8.9–10.3)
GFR calc non Af Amer: >60 mL/min (ref >60)
GLUCOSE: 92 mg/dL (ref 65–99)
Potassium: 3.6 mmol/L (ref 3.5–5.1)
SODIUM: 140 mmol/L (ref 135–145)
Total Bilirubin: 1.4 mg/dL — ABNORMAL HIGH (ref 0.3–1.2)
Total Protein: 7 g/dL (ref 6.5–8.1)

## 2015-04-26 LAB — URINALYSIS, ROUTINE W REFLEX MICROSCOPIC
Bilirubin Urine: NEGATIVE
GLUCOSE, UA: NEGATIVE mg/dL
HGB URINE DIPSTICK: NEGATIVE
KETONES UR: 15 mg/dL — AB
LEUKOCYTES UA: NEGATIVE
Nitrite: NEGATIVE
PH: 6 (ref 5.0–8.0)
Protein, ur: NEGATIVE mg/dL
Specific Gravity, Urine: 1.023 (ref 1.005–1.030)
Urobilinogen, UA: 1 mg/dL (ref 0.0–1.0)

## 2015-04-26 LAB — CBC
HCT: 40.4 % (ref 36.0–46.0)
Hemoglobin: 13.8 g/dL (ref 12.0–15.0)
MCH: 30.8 pg (ref 26.0–34.0)
MCHC: 34.2 g/dL (ref 30.0–36.0)
MCV: 90.2 fL (ref 78.0–100.0)
PLATELETS: 307 10*3/uL (ref 150–400)
RBC: 4.48 MIL/uL (ref 3.87–5.11)
RDW: 12.8 % (ref 11.5–15.5)
WBC: 10.7 10*3/uL — ABNORMAL HIGH (ref 4.0–10.5)

## 2015-04-26 LAB — I-STAT BETA HCG BLOOD, ED (MC, WL, AP ONLY): I-stat hCG, quantitative: <5 m[IU]/mL (ref <5)

## 2015-04-26 LAB — LIPASE, BLOOD: LIPASE: 37 U/L (ref 22–51)

## 2015-04-26 MED ORDER — HYDROCODONE-ACETAMINOPHEN 5-325 MG PO TABS: 0.5000 | ORAL_TABLET | ORAL | Status: DC | PRN

## 2015-04-26 MED ORDER — ONDANSETRON 4 MG PO TBDP
4.0000 mg | ORAL_TABLET | Freq: Once | ORAL | Status: AC | PRN
  Administered 2015-04-26: 4 mg via ORAL

## 2015-04-26 MED ORDER — OMEPRAZOLE 20 MG PO CPDR: 20.0000 mg | DELAYED_RELEASE_CAPSULE | Freq: Every day | ORAL | Status: DC

## 2015-04-26 MED ORDER — DICYCLOMINE HCL 20 MG PO TABS: 20.0000 mg | ORAL_TABLET | Freq: Two times a day (BID) | ORAL | Status: DC

## 2015-04-26 MED ORDER — OXYCODONE-ACETAMINOPHEN 5-325 MG PO TABS
2.0000 | ORAL_TABLET | Freq: Once | ORAL | Status: AC
  Administered 2015-04-26: 2 via ORAL
  Filled 2015-04-26: qty 2

## 2015-04-26 MED ORDER — ONDANSETRON 4 MG PO TBDP
ORAL_TABLET | ORAL | Status: AC
  Filled 2015-04-26: qty 1

## 2015-04-26 NOTE — ED Notes (Signed)
Pt sts N/V/D x 1 week worse over last 2 days

## 2015-04-26 NOTE — ED Provider Notes (Signed)
CSN: [id number]     Arrival date & time 04/26/15  1227 History  This chart was scribed for non-physician practitioner Arthor Captain, PA-C working with Vanetta Mulders, MD by Lyndel Safe, ED Scribe. This patient was seen in room TR01C/TR01C and the patient's care was started at 1:11 PM.    Chief Complaint  Patient presents with  . Emesis  . Diarrhea   The history is provided by the patient. No language interpreter was used.   HPI Comments: English Jacqueline Erickson is a 24 y.o. Female, with a PMhx of migraines, who presents to the Emergency Department complaining of progressively worsening, constant, severe epigastric abdominal pian onset 5.5 hours ago. She notes the pain radiates to RUQ and was severe enough this morning to inhibit pt from attending class. The pt also reports vomiting with onset of worsening pain this morning at 8am that persisted every 15 minutes until 12pm. Pt reports initial onset of abdominal pain 11 days ago. She notes migraines associated with the pain at onset over a week ago that were resolved with 800 mg ibuprofen. No migraine currently. The Ibuprofen did not touch her abdominal pain. The pt visited the urgent care yesterday for the same complaint but a milder pain. She was diagnosed with gastritis but did not fill her prescriptions for Zantac, Prilosec, Zofran, and Carafate. She was prompted to the ED today for worsening of pain and vomiting. With onset of pain 11 days ago, the pt notes the epigastric pain was intermittent and moderate. She reports 4 days after onset, 7 days ago, her pain worsened and she began to have watery diarrhea and completely liquid emesis. She notes at this point she was unable to keep fluid or food down, with her pain being exacerbated with eating anything but specifically diary foods. She denies a PMhx of lactose intolerance, PShx of abdominal surgery, recent travel, fevers, blood in emesis, blood in stool, or any urinary symptoms. The pt additionally  states she took 2 home pregnancy tests which were both negative last month. She has Nexplanon implant in arm but notes it is expired. Appointment with OB GYN scheduled.   Past Medical History  Diagnosis Date  . Depression   . Migraine    History reviewed. No pertinent past surgical history. Family History  Problem Relation Age of Onset  . Diabetes Mother   . Hypertension Mother   . Diabetes Father   . Hypertension Father   . Hyperlipidemia Father    Social History  Substance Use Topics  . Smoking status: Never Smoker   . Smokeless tobacco: Never Used  . Alcohol Use: No   OB History    Gravida Para Term Preterm AB TAB SAB Ectopic Multiple Living       Review of Systems  Constitutional: Negative for fever and chills.  Gastrointestinal: Positive for nausea, vomiting, abdominal pain and diarrhea. Negative for blood in stool.  Genitourinary: Negative for dysuria, urgency and frequency.  Musculoskeletal: Negative for myalgias.  All other systems reviewed and are negative.  Allergies  Garlic powder; Histamine; and Watermelon flavor  Home Medications   Prior to Admission medications   Medication Sig Start Date End Date Taking? Authorizing Provider  cyclobenzaprine (FLEXERIL) 10 MG tablet Take 1 tablet (10 mg total) by mouth 3 (three) times daily as needed for muscle spasms. 01/09/15   Charm Rings, MD  etonogestrel (NEXPLANON) 68 MG IMPL implant Inject 1 each into the skin  once.    Historical Provider, MD  ibuprofen (ADVIL,MOTRIN) 800 MG tablet Take 1 tablet (800 mg total) by mouth 3 (three) times daily. For 1 week, then as needed 01/09/15   Charm Rings, MD  ondansetron (ZOFRAN) 4 MG tablet Take 1 tablet (4 mg total) by mouth every 6 (six) hours. 04/25/15   Hayden Rasmussen, NP  sucralfate (CARAFATE) 1 G tablet Take 1 tablet (1 g total) by mouth 4 (four) times daily -  with meals and at bedtime. 04/25/15   Hayden Rasmussen, NP   BP 128/90 mmHg  Pulse 67  Temp(Src) 97.7  F (36.5 C) (Oral)  Resp 14  Ht 5\' 2"  (1.575 m)  Wt 189 lb 9.6 oz (86.002 kg)  BMI 34.67 kg/m2  SpO2 100%  LMP 04/08/2015 Physical Exam  Constitutional: She is oriented to person, place, and time. She appears well-developed and well-nourished. No distress.  HENT:  Head: Normocephalic and atraumatic.  Eyes: Conjunctivae are normal. No scleral icterus.  Neck: Normal range of motion.  Cardiovascular: Normal rate, regular rhythm and normal heart sounds.  Exam reveals no gallop and no friction rub.   No murmur heard. Pulmonary/Chest: Effort normal and breath sounds normal. No respiratory distress. She has no wheezes.  Abdominal: Soft. Bowel sounds are normal. She exhibits no distension and no mass. There is tenderness in the right upper quadrant. There is positive Murphy's sign. There is no rebound, no guarding and no tenderness at McBurney's point.    Neurological: She is alert and oriented to person, place, and time.  Skin: Skin is warm and dry. She is not diaphoretic.  Nursing note and vitals reviewed.   ED Course  Procedures  DIAGNOSTIC STUDIES: Oxygen Saturation is 100% on RA, normal by my interpretation.    COORDINATION OF CARE: 1:28 PM Discussed treatment plan which includes to order diagnostic labs, Percocet, and Zofran with pt. Pt acknowledges and agrees to plan.   Labs Review Labs Reviewed  COMPREHENSIVE METABOLIC PANEL - Abnormal; Notable for the following:    AST 13 (*)    ALT 10 (*)    Total Bilirubin 1.4 (*)    All other components within normal limits  CBC - Abnormal; Notable for the following:    WBC 10.7 (*)    All other components within normal limits  URINALYSIS, ROUTINE W REFLEX MICROSCOPIC (NOT AT [facility]) - Abnormal; Notable for the following:    APPearance CLOUDY (*)    Ketones, ur 15 (*)    All other components within normal limits  LIPASE, BLOOD  I-STAT BETA HCG BLOOD, ED ([facility], [facility], [facility] ONLY)    Imaging Review US Abdomen Complete  04/26/2015    CLINICAL DATA:  Right upper quadrant pain for 5 days.  EXAM: ULTRASOUND ABDOMEN COMPLETE  COMPARISON:  None.  FINDINGS: Gallbladder: No gallstones or wall thickening visualized. No sonographic Murphy sign noted.  Common bile duct: Diameter: 3.1 mm  Liver: No focal lesion identified. Within normal limits in parenchymal echogenicity.  IVC: No abnormality visualized.  Pancreas: Visualized portion unremarkable.  Spleen: Size and appearance within normal limits.  Right Kidney: Length: 11.8 cm. Echogenicity within normal limits. No mass or hydronephrosis visualized.  Left Kidney: Length: 11.6 cm. Echogenicity within normal limits. No mass or hydronephrosis visualized.  Abdominal aorta: No aneurysm visualized.  Other findings: None.  IMPRESSION: Normal abdomen ultrasound.  The gallbladder is normal.   Electronically Signed   By: Sherian Rein M.D.   On: 04/26/2015 15:27   I  have personally reviewed and evaluated these images and lab results as part of my medical decision-making.   EKG Interpretation None      MDM   Final diagnoses:  None    1:52 PM BP 128/90 mmHg  Pulse 67  Temp(Src) 97.7 F (36.5 C) (Oral)  Resp 14  Ht 5\' 2"  (1.575 m)  Wt 189 lb 9.6 oz (86.002 kg)  BMI 34.67 kg/m2  SpO2 100%  LMP 04/08/2015 Haruna J Schinke This 24 year old female with 10+ days of ongoing right upper quadrant and epigastric abdominal pain with associated nausea, vomiting, and loose stools. Pain is worse postprandial. She is a positive Murphy sign and right upper quadrant tenderness on examination. Her CMP has resulted and shows low AST and ALT, alkaline phosphatase is normal. However, she has an elevated bilirubin. Patient will receive a right upper quadrant abdominal ultrasound. Pain medications and antibiotics given. Patient appears comfortable at this time. \ 3:33 PM BP 128/90 mmHg  Pulse 67  Temp(Src) 97.7 F (36.5 C) (Oral)  Resp 14  Ht 5\' 2"  (1.575 m)  Wt 189 lb 9.6 oz (86.002 kg)  BMI 34.67  kg/m2  SpO2 100%  LMP 04/08/2015 Patient ultrasound without abnormality. She has had no active vomiting or diarrhea here in the emergency department. Patient will be discharged with Symptomatic treatment. Patient will also have a referral to [facility] surgery for HIDA scan. Gastroenterology for upper endoscopy She appears safe for discharge at this time.  I personally performed the services described in this documentation, which was scribed in my presence. The recorded information has been reviewed and is accurate.      Arthor Captain, PA-C 04/26/15 2233  Vanetta Mulders, MD 04/27/15 [id number]

## 2015-04-26 NOTE — Discharge Instructions (Signed)
Abdominal Pain Many things can cause abdominal pain. Usually, abdominal pain is not caused by a disease and will improve without treatment. It can often be observed and treated at home. Your health care provider will do a physical exam and possibly order blood tests and X-rays to help determine the seriousness of your pain. However, in many cases, more time must pass before a clear cause of the pain can be found. Before that point, your health care provider may not know if you need more testing or further treatment. HOME CARE INSTRUCTIONS  Monitor your abdominal pain for any changes. The following actions may help to alleviate any discomfort you are experiencing:  Only take over-the-counter or prescription medicines as directed by your health care provider.  Do not take laxatives unless directed to do so by your health care provider.  Try a clear liquid diet (broth, tea, or water) as directed by your health care provider. Slowly move to a bland diet as tolerated. SEEK MEDICAL CARE IF:  You have unexplained abdominal pain.  You have abdominal pain associated with nausea or diarrhea.  You have pain when you urinate or have a bowel movement.  You experience abdominal pain that wakes you in the night.  You have abdominal pain that is worsened or improved by eating food.  You have abdominal pain that is worsened with eating fatty foods.  You have a fever. SEEK IMMEDIATE MEDICAL CARE IF:   Your pain does not go away within 2 hours.  You keep throwing up (vomiting).  Your pain is felt only in portions of the abdomen, such as the right side or the left lower portion of the abdomen.  You pass bloody or black tarry stools. MAKE SURE YOU:  Understand these instructions.   Will watch your condition.   Will get help right away if you are not doing well or get worse.  Document Released: 05/23/2005 Document Revised: 08/18/2013 Document Reviewed: 04/22/2013 [facility] Patient Information  2015 [place], [place]. This information is not intended to replace advice given to you by your health care provider. Make sure you discuss any questions you have with your health care provider.  Biliary Colic  Biliary colic is a steady or irregular pain in the upper abdomen. It is usually under the right side of the rib cage. It happens when gallstones interfere with the normal flow of bile from the gallbladder. Bile is a liquid that helps to digest fats. Bile is made in the liver and stored in the gallbladder. When you eat a meal, bile passes from the gallbladder through the cystic duct and the common bile duct into the small intestine. There, it mixes with partially digested food. If a gallstone blocks either of these ducts, the normal flow of bile is blocked. The muscle cells in the bile duct contract forcefully to try to move the stone. This causes the pain of biliary colic.  SYMPTOMS   A person with biliary colic usually complains of pain in the upper abdomen. This pain can be:  In the center of the upper abdomen just below the breastbone.  In the upper-right part of the abdomen, near the gallbladder and liver.  Spread back toward the right shoulder blade.  Nausea and vomiting.  The pain usually occurs after eating.  Biliary colic is usually triggered by the digestive system's demand for bile. The demand for bile is high after fatty meals. Symptoms can also occur when a person who has been fasting suddenly eats a very  large meal. Most episodes of biliary colic pass after 1 to 5 hours. After the most intense pain passes, your abdomen may continue to ache mildly for about 24 hours. DIAGNOSIS  After you describe your symptoms, your caregiver will perform a physical exam. He or she will pay attention to the upper right portion of your belly (abdomen). This is the area of your liver and gallbladder. An ultrasound will help your caregiver look for gallstones. Specialized scans of the gallbladder may  also be done. Blood tests may be done, especially if you have fever or if your pain persists. PREVENTION  Biliary colic can be prevented by controlling the risk factors for gallstones. Some of these risk factors, such as heredity, increasing age, and pregnancy are a normal part of life. Obesity and a high-fat diet are risk factors you can change through a healthy lifestyle. Women going through menopause who take hormone replacement therapy (estrogen) are also more likely to develop biliary colic. TREATMENT   Pain medication may be prescribed.  You may be encouraged to eat a fat-free diet.  If the first episode of biliary colic is severe, or episodes of colic keep retuning, surgery to remove the gallbladder (cholecystectomy) is usually recommended. This procedure can be done through small incisions using an instrument called a laparoscope. The procedure often requires a brief stay in the hospital. Some people can leave the hospital the same day. It is the most widely used treatment in people troubled by painful gallstones. It is effective and safe, with no complications in more than 90% of cases.  If surgery cannot be done, medication that dissolves gallstones may be used. This medication is expensive and can take months or years to work. Only small stones will dissolve.  Rarely, medication to dissolve gallstones is combined with a procedure called shock-wave lithotripsy. This procedure uses carefully aimed shock waves to break up gallstones. In many people treated with this procedure, gallstones form again within a few years. PROGNOSIS  If gallstones block your cystic duct or common bile duct, you are at risk for repeated episodes of biliary colic. There is also a 25% chance that you will develop a gallbladder infection(acute cholecystitis), or some other complication of gallstones within 10 to 20 years. If you have surgery, schedule it at a time that is convenient for you and at a time when you are  not sick. HOME CARE INSTRUCTIONS   Drink plenty of clear fluids.  Avoid fatty, greasy or fried foods, or any foods that make your pain worse.  Take medications as directed. SEEK MEDICAL CARE IF:   You develop a fever over 100.5 F (38.1 C).  Your pain gets worse over time.  You develop nausea that prevents you from eating and drinking.  You develop vomiting. SEEK IMMEDIATE MEDICAL CARE IF:   You have continuous or severe belly (abdominal) pain which is not relieved with medications.  You develop nausea and vomiting which is not relieved with medications.  You have symptoms of biliary colic and you suddenly develop a fever and shaking chills. This may signal cholecystitis. Call your caregiver immediately.  You develop a yellow color to your skin or the white part of your eyes (jaundice). Document Released: 01/14/2006 Document Revised: 11/05/2011 Document Reviewed: 03/25/2008 [facility] Patient Information 2015 [place], [place]. This information is not intended to replace advice given to you by your health care provider. Make sure you discuss any questions you have with your health care provider.  HIDA (Hepatobiliary) Scan Your  caregiver has suggested that you have a HIDA Scan. This is also known as a hepatobiliary scan. The HIDA Scan helps evaluate the hepatobiliary system (liver and gallbladder and their ducts). Your liver is the organ in your body that produces bile. The bile is then collected in the gallbladder. The bile is stored and concentrated in the gallbladder. The bile is excreted (passed) into the small intestine when it is needed for digestion. A stone can block the duct (tube) leading from the gallbladder to the small intestine. This can cause an inflammation of the gallbladder (cholecystitis). Because bile is always needed for fat processing, you may feel a gallbladder attack especially after eating a fatty meal. LET YOUR CAREGIVER KNOW ABOUT:  Allergies.  Medications  taken including herbs, eye drops, over the counter medications, and creams.  Use of steroids (by mouth or creams).  Previous problems with anesthetics or novocaine.  Possibility of pregnancy, if this applies.  History of blood clots (thrombophlebitis).  History of bleeding or blood problems.  Previous surgery.  Other health problems. BEFORE THE PROCEDURE  Do not eat or drink anything after midnight the night before the exam as instructed.  You may take medications with a small amount of water the morning of the exam unless your caregiver instructs you otherwise. You should be present 60 minutes prior to your procedure or as directed.  PROCEDURE   An IV will be placed in your arm and remain throughout the exam.  A small amount of very short acting radioactive material will be injected into the IV.  While lying down a special camera will be placed over your abdomen (belly). This camera is used to detect the injected material. The camera will place images on film. A radiologist (specialist in reading x-rays) can evaluate the images. It will help determine how well your gallbladder is working.  You will then be given a material called CCK. This will make your gallbladder contract. It occasionally causes symptoms (problems) that mimic a gallbladder attack or the feeling you have after eating a fatty meal.  The entire test usually takes one to two hours. Your caregiver can give you more accurate times. Following the test you may go home and resume normal activities and diet as instructed. Ask your caregiver how you are to find out your results. Remember, it is your responsibility to find out the results of your test. Do not assume everything is all right or "normal" if you have not heard from your caregiver. Document Released: 08/10/2000 Document Revised: 11/05/2011 Document Reviewed: 12/16/2013 [facility] Patient Information 2015 [place], [place]. This information is not intended to replace  advice given to you by your health care provider. Make sure you discuss any questions you have with your health care provider.  Food Choices for Peptic Ulcer Disease When you have peptic ulcer disease, the foods you eat and your eating habits are very important. Choosing the right foods can help ease the discomfort of peptic ulcer disease. WHAT GENERAL GUIDELINES DO I NEED TO FOLLOW?  Choose fruits, vegetables, whole grains, and low-fat meat, fish, and poultry.   Keep a food diary to identify foods that cause symptoms.  Avoid foods that cause irritation or pain. These may be different for different people.  Eat frequent small meals instead of three large meals each day. The pain may be worse when your stomach is empty.  Avoid eating close to bedtime. WHAT FOODS ARE NOT RECOMMENDED? The following are some foods and drinks that may worsen  your symptoms:  Black, white, and red pepper.  Hot sauce.  Chili peppers.  Chili powder.  Chocolate and cocoa.   Alcohol.  Tea, coffee, and cola (regular and decaffeinated). The items listed above may not be a complete list of foods and beverages to avoid. Contact your dietitian for more information. Document Released: 11/05/2011 Document Revised: 08/18/2013 Document Reviewed: 06/17/2013 [facility] Patient Information 2015 [place], [place]. This information is not intended to replace advice given to you by your health care provider. Make sure you discuss any questions you have with your health care provider. Peptic Ulcer A peptic ulcer is a sore in the lining of your esophagus (esophageal ulcer), stomach (gastric ulcer), or in the first part of your small intestine (duodenal ulcer). The ulcer causes erosion into the deeper tissue. CAUSES  Normally, the lining of the stomach and the small intestine protects itself from the acid that digests food. The protective lining can be damaged by:  An infection caused by a bacterium called Helicobacter  pylori (H. pylori).  Regular use of nonsteroidal anti-inflammatory drugs (NSAIDs), such as ibuprofen or aspirin.  Smoking tobacco. Other risk factors include being older than 50, drinking alcohol excessively, and having a family history of ulcer disease.  SYMPTOMS   Burning pain or gnawing in the area between the chest and the belly button.  Heartburn.  Nausea and vomiting.  Bloating. The pain can be worse on an empty stomach and at night. If the ulcer results in bleeding, it can cause:  Black, tarry stools.  Vomiting of bright red blood.  Vomiting of coffee-ground-looking materials. DIAGNOSIS  A diagnosis is usually made based upon your history and an exam. Other tests and procedures may be performed to find the cause of the ulcer. Finding a cause will help determine the best treatment. Tests and procedures may include:  Blood tests, stool tests, or breath tests to check for the bacterium H. pylori.  An upper gastrointestinal (GI) series of the esophagus, stomach, and small intestine.  An endoscopy to examine the esophagus, stomach, and small intestine.  A biopsy. TREATMENT  Treatment may include:  Eliminating the cause of the ulcer, such as smoking, NSAIDs, or alcohol.  Medicines to reduce the amount of acid in your digestive tract.  Antibiotic medicines if the ulcer is caused by the H. pylori bacterium.  An upper endoscopy to treat a bleeding ulcer.  Surgery if the bleeding is severe or if the ulcer created a hole somewhere in the digestive system. HOME CARE INSTRUCTIONS   Avoid tobacco, alcohol, and caffeine. Smoking can increase the acid in the stomach, and continued smoking will impair the healing of ulcers.  Avoid foods and drinks that seem to cause discomfort or aggravate your ulcer.  Only take medicines as directed by your caregiver. Do not substitute over-the-counter medicines for prescription medicines without talking to your caregiver.  Keep any  follow-up appointments and tests as directed. SEEK MEDICAL CARE IF:   Your do not improve within 7 days of starting treatment.  You have ongoing indigestion or heartburn. SEEK IMMEDIATE MEDICAL CARE IF:   You have sudden, sharp, or persistent abdominal pain.  You have bloody or dark black, tarry stools.  You vomit blood or vomit that looks like coffee grounds.  You become light-headed, weak, or feel faint.  You become sweaty or clammy. MAKE SURE YOU:   Understand these instructions.  Will watch your condition.  Will get help right away if you are not doing well or get  worse. Document Released: 08/10/2000 Document Revised: 12/28/2013 Document Reviewed: 03/12/2012 [facility] Patient Information 2015 [place], [place]. This information is not intended to replace advice given to you by your health care provider. Make sure you discuss any questions you have with your health care provider.

## 2015-04-26 NOTE — ED Notes (Signed)
Pt was seen here yesterday for upper abd pain. Returns today for continued discomfort. Denies N/V but states has diarrhea.

## 2015-04-28 ENCOUNTER — Other Ambulatory Visit: Payer: Self-pay | Admitting: Surgery

## 2015-04-28 DIAGNOSIS — R109 Unspecified abdominal pain: Secondary | ICD-10-CM

## 2015-04-28 DIAGNOSIS — R112 Nausea with vomiting, unspecified: Secondary | ICD-10-CM

## 2015-05-06 ENCOUNTER — Ambulatory Visit: Payer: Medicaid Other | Admitting: Obstetrics & Gynecology

## 2015-05-09 ENCOUNTER — Other Ambulatory Visit: Payer: BLUE CROSS/BLUE SHIELD

## 2015-05-13 ENCOUNTER — Other Ambulatory Visit: Payer: BLUE CROSS/BLUE SHIELD

## 2015-05-19 ENCOUNTER — Other Ambulatory Visit: Payer: BLUE CROSS/BLUE SHIELD

## 2015-06-24 ENCOUNTER — Emergency Department (HOSPITAL_COMMUNITY)
Admission: EM | Admit: 2015-06-24 | Discharge: 2015-06-24 | Disposition: A | Discharge status: Home / Self Care | Payer: BLUE CROSS/BLUE SHIELD | Source: Home / Self Care | Attending: Family Medicine | Admitting: Family Medicine

## 2015-06-24 ENCOUNTER — Encounter (HOSPITAL_COMMUNITY): Payer: Self-pay | Admitting: Emergency Medicine

## 2015-06-24 ENCOUNTER — Emergency Department (INDEPENDENT_AMBULATORY_CARE_PROVIDER_SITE_OTHER): Payer: BLUE CROSS/BLUE SHIELD

## 2015-06-24 DIAGNOSIS — J069 Acute upper respiratory infection, unspecified: Secondary | ICD-10-CM

## 2015-06-24 MED ORDER — IPRATROPIUM BROMIDE 0.06 % NA SOLN: 2.0000 | Freq: Four times a day (QID) | NASAL | Status: DC

## 2015-06-24 MED ORDER — PSEUDOEPH-BROMPHEN-DM 30-2-10 MG/5ML PO SYRP: 10.0000 mL | ORAL_SOLUTION | Freq: Four times a day (QID) | ORAL | Status: DC | PRN

## 2015-06-24 NOTE — ED Provider Notes (Signed)
CSN: [id number]     Arrival date & time 06/24/15  1432 History   First MD Initiated Contact with Patient 06/24/15 1506     Chief Complaint  Patient presents with  . Chest Pain  . URI   (Consider location/radiation/quality/duration/timing/severity/associated sxs/prior Treatment) Patient is a 24 y.o. female presenting with URI. The history is provided by the patient.  URI Presenting symptoms: congestion, cough and rhinorrhea   Presenting symptoms: no sore throat   Severity:  Mild Onset quality:  Gradual Duration:  1 week Progression:  Unchanged Chronicity:  New Ineffective treatments: seen by lmd 3 times this week for unchanged sx. Associated symptoms: sinus pain   Risk factors: no sick contacts     Past Medical History  Diagnosis Date  . Depression   . Migraine    History reviewed. No pertinent past surgical history. Family History  Problem Relation Age of Onset  . Diabetes Mother   . Hypertension Mother   . Diabetes Father   . Hypertension Father   . Hyperlipidemia Father    Social History  Substance Use Topics  . Smoking status: Never Smoker   . Smokeless tobacco: Never Used  . Alcohol Use: No   OB History    Gravida Para Term Preterm AB TAB SAB Ectopic Multiple Living       Review of Systems  Constitutional: Negative.   HENT: Positive for congestion, postnasal drip and rhinorrhea. Negative for sore throat.   Respiratory: Positive for cough and shortness of breath.   Cardiovascular: Negative.  Negative for palpitations.  All other systems reviewed and are negative.   Allergies  Garlic powder; Histamine; and Watermelon flavor  Home Medications   Prior to Admission medications   Medication Sig Start Date End Date Taking? Authorizing Provider  brompheniramine-pseudoephedrine-DM 30-2-10 MG/5ML syrup Take 10 mLs by mouth 4 (four) times daily as needed. 06/24/15   Linna Hoff, MD  cyclobenzaprine (FLEXERIL) 10 MG tablet Take 1 tablet  (10 mg total) by mouth 3 (three) times daily as needed for muscle spasms. 01/09/15   Charm Rings, MD  dicyclomine (BENTYL) 20 MG tablet Take 1 tablet (20 mg total) by mouth 2 (two) times daily. 04/26/15   Arthor Captain, PA-C  etonogestrel (NEXPLANON) 68 MG IMPL implant Inject 1 each into the skin once.    Historical Provider, MD  HYDROcodone-acetaminophen (NORCO) 5-325 MG per tablet Take 0.5-1 tablets by mouth every 4 (four) hours as needed. 04/26/15   Arthor Captain, PA-C  ibuprofen (ADVIL,MOTRIN) 800 MG tablet Take 1 tablet (800 mg total) by mouth 3 (three) times daily. For 1 week, then as needed 01/09/15   Charm Rings, MD  ipratropium (ATROVENT) 0.06 % nasal spray Place 2 sprays into both nostrils 4 (four) times daily. 06/24/15   Linna Hoff, MD  omeprazole (PRILOSEC) 20 MG capsule Take 1 capsule (20 mg total) by mouth daily. 04/26/15   Arthor Captain, PA-C  ondansetron (ZOFRAN) 4 MG tablet Take 1 tablet (4 mg total) by mouth every 6 (six) hours. 04/25/15   Hayden Rasmussen, NP  sucralfate (CARAFATE) 1 G tablet Take 1 tablet (1 g total) by mouth 4 (four) times daily -  with meals and at bedtime. 04/25/15   Hayden Rasmussen, NP   Meds Ordered and Administered this Visit  Medications - No data to display  BP 132/85 mmHg  Pulse 73  Temp(Src) 98.5 F (36.9 C) (Oral)  SpO2 100%  LMP 06/02/2015 No data found.   Physical Exam  Constitutional: She is oriented to person, place, and time. She appears well-developed and well-nourished. No distress.  HENT:  Right Ear: External ear normal.  Left Ear: External ear normal.  Mouth/Throat: Oropharynx is clear and moist.  Eyes: Conjunctivae are normal. Pupils are equal, round, and reactive to light.  Neck: Normal range of motion. Neck supple.  Cardiovascular: Regular rhythm, normal heart sounds and intact distal pulses.   Pulmonary/Chest: Effort normal and breath sounds normal.  Lymphadenopathy:    She has no cervical adenopathy.  Neurological: She is alert and  oriented to person, place, and time.  Skin: Skin is warm and dry.  Nursing note and vitals reviewed.   ED Course  Procedures (including critical care time)  Labs Review Labs Reviewed - No data to display  Imaging Review Dg Chest 2 View  06/24/2015  CLINICAL DATA:  Chest pain, short of breath 6 days Initial encounter. EXAM: CHEST  2 VIEW COMPARISON:  CT thorax 07/17/2005 FINDINGS: Normal mediastinum and cardiac silhouette. Normal pulmonary vasculature. No evidence of effusion, infiltrate, or pneumothorax. No acute bony abnormality. IMPRESSION: No acute cardiopulmonary process. Electronically Signed   By: Genevive Bi M.D.   On: 06/24/2015 15:30    X-rays reviewed and report per radiologist.  Visual Acuity Review  Right Eye Distance:   Left Eye Distance:   Bilateral Distance:    Right Eye Near:   Left Eye Near:    Bilateral Near:         MDM   1. URI (upper respiratory infection)        Linna Hoff, MD 06/24/15 1550

## 2015-06-24 NOTE — ED Notes (Signed)
Patient complains of tightness in chest.  Patient reports symptoms started Sunday.  Since onset, patient has been to her pcp 3 times and here today.  Started on sudafed and tussin, no improvement.  Then started on augmentin, had an allergic reaction.  Then started on z-pac and albuterol.  Since then cough improved, but still feels tightness

## 2015-06-24 NOTE — Discharge Instructions (Signed)
Drink plenty of fluids as discussed, use medicine as prescribed, and mucinex or delsym for cough. Return or see your doctor if further problems °

## 2015-07-29 ENCOUNTER — Emergency Department (HOSPITAL_COMMUNITY)
Admission: EM | Admit: 2015-07-29 | Discharge: 2015-07-29 | Disposition: A | Discharge status: Home / Self Care | Payer: BLUE CROSS/BLUE SHIELD | Attending: Emergency Medicine | Admitting: Emergency Medicine

## 2015-07-29 ENCOUNTER — Encounter (HOSPITAL_COMMUNITY): Payer: Self-pay | Admitting: Emergency Medicine

## 2015-07-29 DIAGNOSIS — J069 Acute upper respiratory infection, unspecified: Secondary | ICD-10-CM | POA: Insufficient documentation

## 2015-07-29 DIAGNOSIS — M791 Myalgia: Secondary | ICD-10-CM | POA: Insufficient documentation

## 2015-07-29 DIAGNOSIS — G43909 Migraine, unspecified, not intractable, without status migrainosus: Secondary | ICD-10-CM | POA: Insufficient documentation

## 2015-07-29 DIAGNOSIS — Z8659 Personal history of other mental and behavioral disorders: Secondary | ICD-10-CM | POA: Insufficient documentation

## 2015-07-29 DIAGNOSIS — H9203 Otalgia, bilateral: Secondary | ICD-10-CM | POA: Insufficient documentation

## 2015-07-29 DIAGNOSIS — Z79899 Other long term (current) drug therapy: Secondary | ICD-10-CM | POA: Insufficient documentation

## 2015-07-29 DIAGNOSIS — R110 Nausea: Secondary | ICD-10-CM | POA: Insufficient documentation

## 2015-07-29 LAB — RAPID STREP SCREEN (MED CTR MEBANE ONLY): Streptococcus, Group A Screen (Direct): NEGATIVE

## 2015-07-29 MED ORDER — FLUTICASONE PROPIONATE 50 MCG/ACT NA SUSP: 2.0000 | Freq: Every day | NASAL | Status: DC

## 2015-07-29 MED ORDER — IBUPROFEN 400 MG PO TABS
800.0000 mg | ORAL_TABLET | Freq: Once | ORAL | Status: AC
  Administered 2015-07-29: 800 mg via ORAL
  Filled 2015-07-29: qty 2

## 2015-07-29 MED ORDER — ONDANSETRON 4 MG PO TBDP
4.0000 mg | ORAL_TABLET | Freq: Once | ORAL | Status: AC
  Administered 2015-07-29: 4 mg via ORAL
  Filled 2015-07-29: qty 1

## 2015-07-29 MED ORDER — PSEUDOEPHEDRINE HCL 30 MG PO TABS: 60.0000 mg | ORAL_TABLET | ORAL | Status: DC | PRN

## 2015-07-29 NOTE — Discharge Instructions (Signed)
Upper Respiratory Infection, Adult Most upper respiratory infections (URIs) are a viral infection of the air passages leading to the lungs. A URI affects the nose, throat, and upper air passages. The most common type of URI is nasopharyngitis and is typically referred to as "the common cold." URIs run their course and usually go away on their own. Most of the time, a URI does not require medical attention, but sometimes a bacterial infection in the upper airways can follow a viral infection. This is called a secondary infection. Sinus and middle ear infections are common types of secondary upper respiratory infections. Bacterial pneumonia can also complicate a URI. A URI can worsen asthma and chronic obstructive pulmonary disease (COPD). Sometimes, these complications can require emergency medical care and may be life threatening.  CAUSES Almost all URIs are caused by viruses. A virus is a type of germ and can spread from one person to another.  RISKS FACTORS You may be at risk for a URI if:   You smoke.   You have chronic heart or lung disease.  You have a weakened defense (immune) system.   You are very young or very old.   You have nasal allergies or asthma.  You work in crowded or poorly ventilated areas.  You work in health care facilities or schools. SIGNS AND SYMPTOMS  Symptoms typically develop 2-3 days after you come in contact with a cold virus. Most viral URIs last 7-10 days. However, viral URIs from the influenza virus (flu virus) can last 14-18 days and are typically more severe. Symptoms may include:   Runny or stuffy (congested) nose.   Sneezing.   Cough.   Sore throat.   Headache.   Fatigue.   Fever.   Loss of appetite.   Pain in your forehead, behind your eyes, and over your cheekbones (sinus pain).  Muscle aches.  DIAGNOSIS  Your health care provider may diagnose a URI by:  Physical exam.  Tests to check that your symptoms are not due to  another condition such as:  Strep throat.  Sinusitis.  Pneumonia.  Asthma. TREATMENT  A URI goes away on its own with time. It cannot be cured with medicines, but medicines may be prescribed or recommended to relieve symptoms. Medicines may help:  Reduce your fever.  Reduce your cough.  Relieve nasal congestion. HOME CARE INSTRUCTIONS   Take medicines only as directed by your health care provider.   Gargle warm saltwater or take cough drops to comfort your throat as directed by your health care provider.  Use a warm mist humidifier or inhale steam from a shower to increase air moisture. This may make it easier to breathe.  Drink enough fluid to keep your urine clear or pale yellow.   Eat soups and other clear broths and maintain good nutrition.   Rest as needed.   Return to work when your temperature has returned to normal or as your health care provider advises. You may need to stay home longer to avoid infecting others. You can also use a face mask and careful hand washing to prevent spread of the virus.  Increase the usage of your inhaler if you have asthma.   Do not use any tobacco products, including cigarettes, chewing tobacco, or electronic cigarettes. If you need help quitting, ask your health care provider. PREVENTION  The best way to protect yourself from getting a cold is to practice good hygiene.   Avoid oral or hand contact with people with cold   symptoms.   Wash your hands often if contact occurs.  There is no clear evidence that vitamin C, vitamin E, echinacea, or exercise reduces the chance of developing a cold. However, it is always recommended to get plenty of rest, exercise, and practice good nutrition.  SEEK MEDICAL CARE IF:   You are getting worse rather than better.   Your symptoms are not controlled by medicine.   You have chills.  You have worsening shortness of breath.  You have brown or red mucus.  You have yellow or brown nasal  discharge.  You have pain in your face, especially when you bend forward.  You have a fever.  You have swollen neck glands.  You have pain while swallowing.  You have white areas in the back of your throat. SEEK IMMEDIATE MEDICAL CARE IF:   You have severe or persistent:  Headache.  Ear pain.  Sinus pain.  Chest pain.  You have chronic lung disease and any of the following:  Wheezing.  Prolonged cough.  Coughing up blood.  A change in your usual mucus.  You have a stiff neck.  You have changes in your:  Vision.  Hearing.  Thinking.  Mood. MAKE SURE YOU:   Understand these instructions.  Will watch your condition.  Will get help right away if you are not doing well or get worse.   This information is not intended to replace advice given to you by your health care provider. Make sure you discuss any questions you have with your health care provider.   Document Released: 02/06/2001 Document Revised: 12/28/2014 Document Reviewed: 11/18/2013 Elsevier Interactive Patient Education 2016 [employer]  

## 2015-07-29 NOTE — ED Notes (Signed)
Pt from home with c/o sore throat, bilateral ear pain, and chest pain that feels like bronchitis x 2 days.  Pt denies fevers at home, N/V/D.  Pt in NAD, A&O.

## 2015-07-29 NOTE — ED Provider Notes (Signed)
CSN: [id number]   Arrival date & time 07/29/15 1535  History  By signing my name below, I, Bethel Born, attest that this documentation has been prepared under the direction and in the presence of Danelle Berry PA-C Electronically Signed: Bethel Born, ED Scribe. 07/29/2015. 5:03 PM. Chief Complaint  Patient presents with  . Sore Throat  . Otalgia    HPI The history is provided by the patient. No language interpreter was used.   Jacqueline Erickson is a 24 y.o. female who presents to the Emergency Department complaining of a worsening, constant, 7/10 in severity, sore throat with onset yesterday. 800 mg of ibuprofen provided insufficient pain relief at home.  Pt states that over the last few weeks she treated her bronchitis with Sudafed and Tussin but denies using any of those medication in the last 2 days . Associated symptoms include bilateral ear pain, chest tightness that is not pleuritic or exacerbated by palpation, temporal headache, rhinorrhea, post-nasal drip, diffuse myalgias, and nausea. Pt denies fever, rash, difficulty swallowing, cough, SOB, and vomiting. She has had no known sick contact.   Past Medical History  Diagnosis Date  . Depression   . Migraine     History reviewed. No pertinent past surgical history.  Family History  Problem Relation Age of Onset  . Diabetes Mother   . Hypertension Mother   . Diabetes Father   . Hypertension Father   . Hyperlipidemia Father     Social History  Substance Use Topics  . Smoking status: Never Smoker   . Smokeless tobacco: Never Used  . Alcohol Use: No     Review of Systems  Constitutional: Negative.  Negative for fever.  HENT: Positive for ear pain, postnasal drip, rhinorrhea and sore throat. Negative for congestion, sinus pressure and trouble swallowing.   Respiratory: Positive for chest tightness. Negative for cough and shortness of breath.   Cardiovascular: Negative.   Gastrointestinal: Positive for nausea. Negative  for vomiting.  Genitourinary: Negative.   Musculoskeletal: Positive for myalgias.  Neurological: Positive for headaches.  Hematological: Negative.   All other systems reviewed and are negative.  Home Medications   Prior to Admission medications   Medication Sig Start Date End Date Taking? Authorizing Provider  brompheniramine-pseudoephedrine-DM 30-2-10 MG/5ML syrup Take 10 mLs by mouth 4 (four) times daily as needed. 06/24/15   Linna Hoff, MD  cyclobenzaprine (FLEXERIL) 10 MG tablet Take 1 tablet (10 mg total) by mouth 3 (three) times daily as needed for muscle spasms. 01/09/15   Charm Rings, MD  dicyclomine (BENTYL) 20 MG tablet Take 1 tablet (20 mg total) by mouth 2 (two) times daily. 04/26/15   Arthor Captain, PA-C  etonogestrel (NEXPLANON) 68 MG IMPL implant Inject 1 each into the skin once.    Historical Provider, MD  fluticasone (FLONASE) 50 MCG/ACT nasal spray Place 2 sprays into both nostrils daily. 07/29/15   Danelle Berry, PA-C  HYDROcodone-acetaminophen (NORCO) 5-325 MG per tablet Take 0.5-1 tablets by mouth every 4 (four) hours as needed. 04/26/15   Arthor Captain, PA-C  ibuprofen (ADVIL,MOTRIN) 800 MG tablet Take 1 tablet (800 mg total) by mouth 3 (three) times daily. For 1 week, then as needed 01/09/15   Charm Rings, MD  ipratropium (ATROVENT) 0.06 % nasal spray Place 2 sprays into both nostrils 4 (four) times daily. 06/24/15   Linna Hoff, MD  omeprazole (PRILOSEC) 20 MG capsule Take 1 capsule (20 mg total) by mouth daily. 04/26/15   Arthor Captain, PA-C  ondansetron (ZOFRAN) 4 MG tablet Take 1 tablet (4 mg total) by mouth every 6 (six) hours. 04/25/15   Hayden Rasmussen, NP  pseudoephedrine (SUDAFED) 30 MG tablet Take 2 tablets (60 mg total) by mouth every 4 (four) hours as needed for congestion. 07/29/15   Danelle Berry, PA-C  sucralfate (CARAFATE) 1 G tablet Take 1 tablet (1 g total) by mouth 4 (four) times daily -  with meals and at bedtime. 04/25/15   Hayden Rasmussen, NP    Allergies   Augmentin; Garlic powder; Histamine; and Watermelon flavor  Triage Vitals: BP 136/71 mmHg  Pulse 86  Temp(Src) 98.2 F (36.8 C) (Oral)  Resp 18  Ht 5\' 2"  (1.575 m)  Wt 202 lb (91.627 kg)  BMI 36.94 kg/m2  SpO2 100%  LMP 07/12/2015 (Exact Date)  Physical Exam  Constitutional: She is oriented to person, place, and time. She appears well-developed and well-nourished. No distress.  HENT:  Head: Normocephalic and atraumatic.  Nose: Right sinus exhibits maxillary sinus tenderness and frontal sinus tenderness. Left sinus exhibits maxillary sinus tenderness and frontal sinus tenderness.  Mouth/Throat: Posterior oropharyngeal erythema present. No oropharyngeal exudate or posterior oropharyngeal edema.  Right TM is normal, translucent pearly grey without erythema.  Left TM is normal, translucent pearly grey without erythema.  Mucosal membranes are erythematous with clear to yellow drainage.   Eyes: EOM are normal.  Neck: Normal range of motion.  Cardiovascular: Normal rate, regular rhythm and normal heart sounds.   Pulmonary/Chest: Effort normal and breath sounds normal.  CTAB  Abdominal: Soft. Bowel sounds are normal. She exhibits no distension. There is no tenderness. There is no rebound and no guarding.  Musculoskeletal: Normal range of motion.  Lymphadenopathy:    She has cervical adenopathy (right-sided).  Neurological: She is alert and oriented to person, place, and time.  Skin: Skin is warm and dry.  Psychiatric: She has a normal mood and affect. Judgment normal.  Nursing note and vitals reviewed.   ED Course  Procedures  DIAGNOSTIC STUDIES: Oxygen Saturation is 100% on RA,  normal by my interpretation.    COORDINATION OF CARE: 4:45 PM Discussed treatment plan which includes lab work with pt at bedside and pt agreed to the plan.  Labs Review-  Labs Reviewed  RAPID STREP SCREEN (NOT AT [facility])  CULTURE, GROUP A STREP    MDM   Patients symptoms are consistent with  URI, likely viral etiology. Discussed that antibiotics are not indicated for viral infections. Pt will be discharged with symptomatic treatment.  Verbalizes understanding and is agreeable with plan. Pt is hemodynamically stable & in NAD prior to dc.   Final diagnoses:  URI (upper respiratory infection)    I personally performed the services described in this documentation, which was scribed in my presence. The recorded information has been reviewed and is accurate.        Danelle Berry, PA-C 08/01/15 0125  Alvira Monday, MD 08/01/15 2207

## 2015-07-31 ENCOUNTER — Emergency Department (HOSPITAL_COMMUNITY)
Admission: EM | Admit: 2015-07-31 | Discharge: 2015-07-31 | Disposition: A | Discharge status: Home / Self Care | Payer: BLUE CROSS/BLUE SHIELD | Attending: Physician Assistant | Admitting: Physician Assistant

## 2015-07-31 ENCOUNTER — Encounter (HOSPITAL_COMMUNITY): Payer: Self-pay | Admitting: Emergency Medicine

## 2015-07-31 DIAGNOSIS — Z793 Long term (current) use of hormonal contraceptives: Secondary | ICD-10-CM | POA: Insufficient documentation

## 2015-07-31 DIAGNOSIS — Z8659 Personal history of other mental and behavioral disorders: Secondary | ICD-10-CM | POA: Insufficient documentation

## 2015-07-31 DIAGNOSIS — Z7951 Long term (current) use of inhaled steroids: Secondary | ICD-10-CM | POA: Insufficient documentation

## 2015-07-31 DIAGNOSIS — J0110 Acute frontal sinusitis, unspecified: Secondary | ICD-10-CM | POA: Insufficient documentation

## 2015-07-31 DIAGNOSIS — Z79899 Other long term (current) drug therapy: Secondary | ICD-10-CM | POA: Insufficient documentation

## 2015-07-31 DIAGNOSIS — G43909 Migraine, unspecified, not intractable, without status migrainosus: Secondary | ICD-10-CM | POA: Insufficient documentation

## 2015-07-31 MED ORDER — IBUPROFEN 800 MG PO TABS: 800.0000 mg | ORAL_TABLET | Freq: Three times a day (TID) | ORAL | Status: DC

## 2015-07-31 MED ORDER — AZITHROMYCIN 250 MG PO TABS: ORAL_TABLET | ORAL | Status: DC

## 2015-07-31 NOTE — Discharge Instructions (Signed)
Please read and follow all provided instructions.  Your diagnoses today include:  1. Acute frontal sinusitis, recurrence not specified    Tests performed today include:  Vital signs. See below for your results today.   Medications prescribed:   Augmentin - antibiotic  You have been prescribed an antibiotic medicine: take the entire course of medicine even if you are feeling better. Stopping early can cause the antibiotic not to work.  Take any prescribed medications only as directed. Treatment for your infection is aimed at treating the symptoms. There are no medications, such as antibiotics, that will cure your infection.   Home care instructions:  Follow any educational materials contained in this packet.   Your illness is contagious and can be spread to others, especially during the first 3 or 4 days. It cannot be cured by antibiotics or other medicines. Take basic precautions such as washing your hands often, covering your mouth when you cough or sneeze, and avoiding public places where you could spread your illness to others.   Please continue drinking plenty of fluids.  Use over-the-counter medicines as needed as directed on packaging for symptom relief.  You may also use ibuprofen or tylenol as directed on packaging for pain or fever.  Do not take multiple medicines containing Tylenol or acetaminophen to avoid taking too much of this medication.  Follow-up instructions: Please follow-up with your primary care provider in the next 3 days for further evaluation of your symptoms if you are not feeling better.   Return instructions:   Please return to the Emergency Department if you experience worsening symptoms.   RETURN IMMEDIATELY IF you develop shortness of breath, confusion or altered mental status, a new rash, become dizzy, faint, or poorly responsive, or are unable to be cared for at home.  Please return if you have persistent vomiting and cannot keep down fluids or  develop a fever that is not controlled by tylenol or motrin.    Please return if you have any other emergent concerns.  Additional Information:  Your vital signs today were: BP 118/76 mmHg   Pulse 59   Temp(Src) 98.2 F (36.8 C) (Oral)   Resp 17   Ht 5\' 2"  (1.575 m)   Wt 91.201 kg   BMI 36.77 kg/m2   SpO2 100%   LMP 07/12/2015 (Exact Date) If your blood pressure (BP) was elevated above 135/85 this visit, please have this repeated by your doctor within one month. --------------

## 2015-07-31 NOTE — ED Provider Notes (Signed)
CSN: [id number]     Arrival date & time 07/31/15  1157 History  By signing my name below, I, Elon Spanner, attest that this documentation has been prepared under the direction and in the presence of Renne Crigler, PA-C. Electronically Signed: Elon Spanner ED Scribe. 07/31/2015. 12:38 PM.    Chief Complaint  Patient presents with  . Nasal Congestion  . Facial Pain   The history is provided by the patient. No language interpreter was used.   HPI Comments: Jacqueline Erickson is a 24 y.o. female who presents to the Emergency Department complaining of sinus pressure with associated nasal congestion, headache, and sore throat onset 3 days ago.  She also reports a a resolved fever TMAX 101 observed 3 days ago.  The patient was seen for the same in the ED on 12/2 where she was prescribed Sudafed, Flonase, and told to use OTC pain medications, which she has done without relief.  She denies vomiting, abdominal pain, dysuria, other complaints.  Vaccinations UTD.  Past Medical History  Diagnosis Date  . Depression   . Migraine    History reviewed. No pertinent past surgical history. Family History  Problem Relation Age of Onset  . Diabetes Mother   . Hypertension Mother   . Diabetes Father   . Hypertension Father   . Hyperlipidemia Father    Social History  Substance Use Topics  . Smoking status: Never Smoker   . Smokeless tobacco: Never Used  . Alcohol Use: No   OB History    Gravida Para Term Preterm AB TAB SAB Ectopic Multiple Living   0 0 0 0 0 0 0 0 0 0      Review of Systems  Constitutional: Positive for fever. Negative for chills and fatigue.  HENT: Positive for congestion, sinus pressure and sore throat. Negative for ear pain and rhinorrhea.   Eyes: Negative for redness.  Respiratory: Positive for cough. Negative for wheezing.   Gastrointestinal: Negative for nausea, vomiting, abdominal pain and diarrhea.  Genitourinary: Negative for dysuria.  Musculoskeletal: Negative for  myalgias and neck stiffness.  Skin: Negative for rash.  Neurological: Positive for headaches.  Hematological: Negative for adenopathy.      Allergies  Augmentin; Garlic powder; Histamine; and Watermelon flavor  Home Medications   Prior to Admission medications   Medication Sig Start Date End Date Taking? Authorizing Provider  brompheniramine-pseudoephedrine-DM 30-2-10 MG/5ML syrup Take 10 mLs by mouth 4 (four) times daily as needed. 06/24/15   Linna Hoff, MD  cyclobenzaprine (FLEXERIL) 10 MG tablet Take 1 tablet (10 mg total) by mouth 3 (three) times daily as needed for muscle spasms. 01/09/15   Charm Rings, MD  dicyclomine (BENTYL) 20 MG tablet Take 1 tablet (20 mg total) by mouth 2 (two) times daily. 04/26/15   Arthor Captain, PA-C  etonogestrel (NEXPLANON) 68 MG IMPL implant Inject 1 each into the skin once.    Historical Provider, MD  fluticasone (FLONASE) 50 MCG/ACT nasal spray Place 2 sprays into both nostrils daily. 07/29/15   Danelle Berry, PA-C  HYDROcodone-acetaminophen (NORCO) 5-325 MG per tablet Take 0.5-1 tablets by mouth every 4 (four) hours as needed. 04/26/15   Arthor Captain, PA-C  ibuprofen (ADVIL,MOTRIN) 800 MG tablet Take 1 tablet (800 mg total) by mouth 3 (three) times daily. For 1 week, then as needed 01/09/15   Charm Rings, MD  ipratropium (ATROVENT) 0.06 % nasal spray Place 2 sprays into both nostrils 4 (four) times daily. 06/24/15   Quita Skye  Kindl, MD  omeprazole (PRILOSEC) 20 MG capsule Take 1 capsule (20 mg total) by mouth daily. 04/26/15   Arthor Captain, PA-C  ondansetron (ZOFRAN) 4 MG tablet Take 1 tablet (4 mg total) by mouth every 6 (six) hours. 04/25/15   Hayden Rasmussen, NP  pseudoephedrine (SUDAFED) 30 MG tablet Take 2 tablets (60 mg total) by mouth every 4 (four) hours as needed for congestion. 07/29/15   Danelle Berry, PA-C  sucralfate (CARAFATE) 1 G tablet Take 1 tablet (1 g total) by mouth 4 (four) times daily -  with meals and at bedtime. 04/25/15   Hayden Rasmussen, NP    BP 118/76 mmHg  Pulse 59  Temp(Src) 98.2 F (36.8 C) (Oral)  Resp 17  Ht  (1.575 m)  Wt 201 lb 1 oz (91.201 kg)  BMI 36.77 kg/m2  SpO2 100%  LMP 07/12/2015 (Exact Date)   Physical Exam  Constitutional: She is oriented to person, place, and time. She appears well-developed and well-nourished. No distress.  HENT:  Head: Normocephalic and atraumatic.  Right Ear: Tympanic membrane, external ear and ear canal normal.  Left Ear: Tympanic membrane, external ear and ear canal normal.  Nose: Mucosal edema present. No rhinorrhea. Right sinus exhibits maxillary sinus tenderness and frontal sinus tenderness. Left sinus exhibits maxillary sinus tenderness and frontal sinus tenderness.  Mouth/Throat: Uvula is midline, oropharynx is clear and moist and mucous membranes are normal. Mucous membranes are not dry. No oral lesions. No trismus in the jaw. No uvula swelling. No oropharyngeal exudate, posterior oropharyngeal edema, posterior oropharyngeal erythema or tonsillar abscesses.  Eyes: Conjunctivae and EOM are normal. Right eye exhibits no discharge. Left eye exhibits no discharge.  Neck: Normal range of motion. Neck supple. No tracheal deviation present.  Cardiovascular: Normal rate, regular rhythm and normal heart sounds.   Pulmonary/Chest: Effort normal and breath sounds normal. No respiratory distress. She has no wheezes. She has no rales.  Abdominal: Soft. There is no tenderness.  Musculoskeletal: Normal range of motion.  Lymphadenopathy:    She has no cervical adenopathy.  Neurological: She is alert and oriented to person, place, and time.  Skin: Skin is warm and dry.  Psychiatric: She has a normal mood and affect. Her behavior is normal.  Nursing note and vitals reviewed.   ED Course  Procedures (including critical care time)  DIAGNOSTIC STUDIES: Oxygen Saturation is 100% on RA, normal by my interpretation.    COORDINATION OF CARE:  12:36 PM Discussed suspicion of viral  etiology.  However, will prescribe antibiotic to be used if no symptom improvement in 48 hours.  Will prescribe 800 mg ibuprofen.  Patient acknowledges and agrees with plan.    Labs Review Labs Reviewed - No data to display  Imaging Review No results found. I have personally reviewed and evaluated these images and lab results as part of my medical decision-making.   EKG Interpretation None       Patient counseled on supportive care s/s to return including worsening symptoms, persistent fever, persistent vomiting, or if they have any other concerns. Urged to see PCP if symptoms persist for more than 3 days. Patient verbalizes understanding and agrees with plan.     MDM   Final diagnoses:  Acute frontal sinusitis, recurrence not specified   Sinusitis 3 days. Patient is having continued symptoms. She did have fever reported at onset. No current fever. Patient encouraged to continue supportive measures for the next 48 hours and take antibiotics if not improved.  I personally  performed the services described in this documentation, which was scribed in my presence. The recorded information has been reviewed and is accurate.    Renne Crigler, PA-C 07/31/15 1318  Courteney Randall An, MD 07/31/15 1553

## 2015-07-31 NOTE — ED Notes (Signed)
Pt. Stated, I have a cold and sinus problem . Came to ER on Friday and told me to take Sudafed , they did not give me an antibiotic and said come back if im worse , and Im no better.

## 2015-08-01 LAB — CULTURE, GROUP A STREP: STREP A CULTURE: NEGATIVE

## 2015-11-03 ENCOUNTER — Ambulatory Visit (INDEPENDENT_AMBULATORY_CARE_PROVIDER_SITE_OTHER): Payer: BLUE CROSS/BLUE SHIELD | Admitting: Obstetrics & Gynecology

## 2015-11-03 ENCOUNTER — Encounter: Payer: Self-pay | Admitting: Obstetrics & Gynecology

## 2015-11-03 VITALS — BP 126/68 | HR 76 | Ht 62.0 in | Wt 202.0 lb

## 2015-11-03 DIAGNOSIS — Z113 Encounter for screening for infections with a predominantly sexual mode of transmission: Secondary | ICD-10-CM | POA: Diagnosis not present

## 2015-11-03 DIAGNOSIS — Z3046 Encounter for surveillance of implantable subdermal contraceptive: Secondary | ICD-10-CM | POA: Diagnosis not present

## 2015-11-03 DIAGNOSIS — Z30017 Encounter for initial prescription of implantable subdermal contraceptive: Secondary | ICD-10-CM

## 2015-11-03 LAB — POCT PREGNANCY, URINE: PREG TEST UR: NEGATIVE

## 2015-11-03 MED ORDER — ETONOGESTREL 68 MG ~~LOC~~ IMPL
68.0000 mg | DRUG_IMPLANT | Freq: Once | SUBCUTANEOUS | Status: AC
  Administered 2015-11-03: 68 mg via SUBCUTANEOUS

## 2015-11-03 NOTE — Patient Instructions (Signed)
Etonogestrel implant What is this medicine? ETONOGESTREL (et oh noe JES trel) is a contraceptive (birth control) device. It is used to prevent pregnancy. It can be used for up to 3 years. This medicine may be used for other purposes; ask your health care provider or pharmacist if you have questions. What should I tell my health care provider before I take this medicine? They need to know if you have any of these conditions: -abnormal vaginal bleeding -blood vessel disease or blood clots -cancer of the breast, cervix, or liver -depression -diabetes -gallbladder disease -headaches -heart disease or recent heart attack -high blood pressure -high cholesterol -kidney disease -liver disease -renal disease -seizures -tobacco smoker -an unusual or allergic reaction to etonogestrel, other hormones, anesthetics or antiseptics, medicines, foods, dyes, or preservatives -pregnant or trying to get pregnant -breast-feeding How should I use this medicine? This device is inserted just under the skin on the inner side of your upper arm by a health care professional. Talk to your pediatrician regarding the use of this medicine in children. Special care may be needed. Overdosage: If you think you have taken too much of this medicine contact a poison control center or emergency room at once. NOTE: This medicine is only for you. Do not share this medicine with others. What if I miss a dose? This does not apply. What may interact with this medicine? Do not take this medicine with any of the following medications: -amprenavir -bosentan -fosamprenavir This medicine may also interact with the following medications: -barbiturate medicines for inducing sleep or treating seizures -certain medicines for fungal infections like ketoconazole and itraconazole -griseofulvin -medicines to treat seizures like carbamazepine, felbamate, oxcarbazepine, phenytoin,  topiramate -modafinil -phenylbutazone -rifampin -some medicines to treat HIV infection like atazanavir, indinavir, lopinavir, nelfinavir, tipranavir, ritonavir -St. John's wort This list may not describe all possible interactions. Give your health care provider a list of all the medicines, herbs, non-prescription drugs, or dietary supplements you use. Also tell them if you smoke, drink alcohol, or use illegal drugs. Some items may interact with your medicine. What should I watch for while using this medicine? This product does not protect you against HIV infection (AIDS) or other sexually transmitted diseases. You should be able to feel the implant by pressing your fingertips over the skin where it was inserted. Contact your doctor if you cannot feel the implant, and use a non-hormonal birth control method (such as condoms) until your doctor confirms that the implant is in place. If you feel that the implant may have broken or become bent while in your arm, contact your healthcare provider. What side effects may I notice from receiving this medicine? Side effects that you should report to your doctor or health care professional as soon as possible: -allergic reactions like skin rash, itching or hives, swelling of the face, lips, or tongue -breast lumps -changes in emotions or moods -depressed mood -heavy or prolonged menstrual bleeding -pain, irritation, swelling, or bruising at the insertion site -scar at site of insertion -signs of infection at the insertion site such as fever, and skin redness, pain or discharge -signs of pregnancy -signs and symptoms of a blood clot such as breathing problems; changes in vision; chest pain; severe, sudden headache; pain, swelling, warmth in the leg; trouble speaking; sudden numbness or weakness of the face, arm or leg -signs and symptoms of liver injury like dark yellow or brown urine; general ill feeling or flu-like symptoms; light-colored stools; loss of  appetite; nausea; right upper belly   pain; unusually weak or tired; yellowing of the eyes or skin -unusual vaginal bleeding, discharge -signs and symptoms of a stroke like changes in vision; confusion; trouble speaking or understanding; severe headaches; sudden numbness or weakness of the face, arm or leg; trouble walking; dizziness; loss of balance or coordination Side effects that usually do not require medical attention (Report these to your doctor or health care professional if they continue or are bothersome.): -acne -back pain -breast pain -changes in weight -dizziness -general ill feeling or flu-like symptoms -headache -irregular menstrual bleeding -nausea -sore throat -vaginal irritation or inflammation This list may not describe all possible side effects. Call your doctor for medical advice about side effects. You may report side effects to FDA at 1-800-FDA-1088. Where should I keep my medicine? This drug is given in a hospital or clinic and will not be stored at home. NOTE: This sheet is a summary. It may not cover all possible information. If you have questions about this medicine, talk to your doctor, pharmacist, or health care provider.    2016, Elsevier/Gold Standard. (2014-05-28 14:07:06)  

## 2015-11-03 NOTE — Progress Notes (Signed)
Patient ID: Jacqueline Erickson, female   DOB: [birth year], 25 y.o.   MRN: [id number] Patient given informed consent, she signed consent form. Her Nexplanon is overdue for a change >1 year.  She wants it removed and replaced.  Appropriate time out taken.  Patient's left arm was prepped and draped in the usual sterile fashion. Patient was prepped with alcohol swab and then injected with 3 ml of 1 % lidocaine.  She was prepped with betadine.  A #11 blade was used to make a small incision over the Nexplanon rod.  Using curved forceps, the end of the rod was grasped and the scar tissue was removed and the device was removed intact.  There was minimal blood loss. Pregnancy test was negative.  Appropriate time out taken for insertion.  Patient's left arm was prepped and draped in the usual sterile fashion. Patient was prepped with alcohol swab and then injected with 5 ml of 1 % lidocaine.  She was prepped with betadine, Nexplanon removed from packaging,  Device confirmed in needle, then inserted full length of needle and withdrawn per handbook instructions.  There was minimal blood loss.  Patient insertion site covered with guaze and a pressure bandage to reduce any bruising.  The patient tolerated the procedure well and was given post procedure instructions. Return in about one month for Nexplanon check.  F/u in 4 weeks for Annual GYN exam  Dameer Speiser L. Harraway-Smith, M.D., [facility]

## 2015-11-04 LAB — GC/CHLAMYDIA PROBE AMP (~~LOC~~) NOT AT ARMC
Chlamydia: NEGATIVE
Neisseria Gonorrhea: POSITIVE — AB

## 2015-11-07 ENCOUNTER — Encounter: Payer: Self-pay | Admitting: *Deleted

## 2015-11-07 ENCOUNTER — Telehealth: Payer: Self-pay | Admitting: General Practice

## 2015-11-07 NOTE — Telephone Encounter (Signed)
Patient has gonorrhea and needs treatment. Called patient, no answer- left message stating we are trying to reach you with you results, please call us back at the clinics. Std card sent.

## 2015-11-08 ENCOUNTER — Ambulatory Visit (INDEPENDENT_AMBULATORY_CARE_PROVIDER_SITE_OTHER): Payer: BLUE CROSS/BLUE SHIELD | Admitting: *Deleted

## 2015-11-08 VITALS — BP 133/80 | HR 71 | Temp 98.3°F

## 2015-11-08 DIAGNOSIS — A549 Gonococcal infection, unspecified: Secondary | ICD-10-CM | POA: Diagnosis not present

## 2015-11-08 MED ORDER — GENTAMICIN SULFATE 40 MG/ML IJ SOLN
240.0000 mg | Freq: Once | INTRAMUSCULAR | Status: AC
  Administered 2015-11-08: 240 mg via INTRAMUSCULAR

## 2015-11-08 MED ORDER — AZITHROMYCIN 250 MG PO TABS: 2000.0000 mg | ORAL_TABLET | Freq: Once | ORAL | Status: DC

## 2015-11-08 MED ORDER — AZITHROMYCIN 250 MG PO TABS
2000.0000 mg | ORAL_TABLET | Freq: Every day | ORAL | Status: DC
  Administered 2015-11-08: 2000 mg via ORAL

## 2015-11-08 NOTE — Telephone Encounter (Signed)
Called patient and she states she already saw the results on mychart. Discussing patient coming in for treatment. Patient states she can come in today @ 230. Also discussed abstaining 2 weeks after treatment and importance of partner getting treated. Patient verbalized understanding to all & had no questions

## 2015-11-08 NOTE — Progress Notes (Signed)
Checked on Jacqueline Erickson and she left after 20 minutes without any complaints or complications.

## 2015-11-08 NOTE — Progress Notes (Signed)
Jacqueline Erickson here for treatment for gonorrhea, she has an allergy to augmentin -hives. Discussed with pharmacy([facility]) and if patient has a severe reaction to augmentin then rocephin not reccommended. States CDC reccommends Gentamicin 240mg  IM once with 2 grams Zithromax if approved by MD. Discussed with Kenyon and she states she had hives, itching, and felt like tongue swelling and was hard to swallow.   Discussed with Dr. Macon Large and will not give rocephin- will give gentamicin. Also discussed with [name].  Also reminded her that her partner should be treated and they should refrain from any sexual contact until 2 weeks after both treated. She voices understanding.  Also asked Brentley to stay in clinic for 20 minutes after injection to monitor for reaction.

## 2015-12-05 ENCOUNTER — Ambulatory Visit: Payer: BLUE CROSS/BLUE SHIELD | Admitting: Obstetrics & Gynecology

## 2016-02-01 ENCOUNTER — Ambulatory Visit (INDEPENDENT_AMBULATORY_CARE_PROVIDER_SITE_OTHER): Payer: BLUE CROSS/BLUE SHIELD | Admitting: Obstetrics & Gynecology

## 2016-02-01 ENCOUNTER — Encounter: Payer: Self-pay | Admitting: Obstetrics & Gynecology

## 2016-02-01 VITALS — BP 110/71 | Ht 62.0 in | Wt 199.3 lb

## 2016-02-01 DIAGNOSIS — Z124 Encounter for screening for malignant neoplasm of cervix: Secondary | ICD-10-CM | POA: Diagnosis not present

## 2016-02-01 DIAGNOSIS — Z01419 Encounter for gynecological examination (general) (routine) without abnormal findings: Secondary | ICD-10-CM | POA: Diagnosis not present

## 2016-02-01 DIAGNOSIS — Z113 Encounter for screening for infections with a predominantly sexual mode of transmission: Secondary | ICD-10-CM | POA: Diagnosis not present

## 2016-02-01 NOTE — Progress Notes (Signed)
Patient ID: Jacqueline Erickson, female   DOB: [birth year], 25 y.o.   MRN: [id number] Subjective:     Jacqueline Erickson is a 25 y.o. female here for a routine exam.  Current complaints: pt re[ports that she ws treated for [facility] but, her partner did not complete his treatment.  They are no longer together but, she wants to be retested. She denies current sx.     Gynecologic History No LMP recorded. Patient is not currently having periods (Reason: Irregular Periods). Contraception: Nexplanon Last Pap: unk.  last mammogram: n/a.  Obstetric History OB History  Gravida Para Term Preterm AB SAB TAB Ectopic Multiple Living  0 0 0 0 0 0 0 0 0 0         The following portions of the patient's history were reviewed and updated as appropriate: allergies, current medications, past family history, past medical history, past social history, past surgical history and problem list.  Review of Systems Pertinent items are noted in HPI.    Objective:  BP 110/71 mmHg  Ht 5\' 2"  (1.575 m)  Wt 199 lb 4.8 oz (90.402 kg)  BMI 36.44 kg/m2 General Appearance:    Alert, cooperative, no distress, appears stated age  Head:    Normocephalic, without obvious abnormality, atraumatic  Eyes:    conjunctiva/corneas clear, EOM's intact, both eyes  Ears:    Normal external ear canals, both ears  Nose:   Nares normal, septum midline, mucosa normal, no drainage    or sinus tenderness  Throat:   Lips, mucosa, and tongue normal; teeth and gums normal  Neck:   Supple, symmetrical, trachea midline, no adenopathy;    thyroid:  no enlargement/tenderness/nodules  Back:     Symmetric, no curvature, ROM normal, no CVA tenderness  Lungs:     Clear to auscultation bilaterally, respirations unlabored  Chest Wall:    No tenderness or deformity   Heart:    Regular rate and rhythm, S1 and S2 normal, no murmur, rub   or gallop  Breast Exam:    No tenderness, masses, or nipple abnormality. bilateral nipple piecrings  Abdomen:     Soft,  non-tender, bowel sounds active all four quadrants,    no masses, no organomegaly  Genitalia:    Normal female without lesion, discharge or tenderness     Extremities:   Extremities normal, atraumatic, no cyanosis or edema  Pulses:   2+ and symmetric all extremities  Skin:   Skin color, texture, turgor normal, no rashes or lesions     Assessment:    Healthy female exam.   H/o STI- with reexposure. No sx    Plan:    Education reviewed: safe sex/STD prevention. Contraception: Nexplanon. Follow up in: 1 year.    F/u PAP and cx Serum STI screen  Taliah Porche L. Harraway-Smith, M.D., [facility]

## 2016-02-01 NOTE — Patient Instructions (Signed)
Sexually Transmitted Disease °A sexually transmitted disease (STD) is a disease or infection that may be passed (transmitted) from person to person, usually during sexual activity. This may happen by way of saliva, semen, blood, vaginal mucus, or urine. Common STDs include: °· Gonorrhea. °· Chlamydia. °· Syphilis. °· HIV and AIDS. °· Genital herpes. °· Hepatitis B and C. °· Trichomonas. °· Human papillomavirus (HPV). °· Pubic lice. °· Scabies. °· Mites. °· Bacterial vaginosis. °WHAT ARE CAUSES OF STDs? °An STD may be caused by bacteria, a virus, or parasites. STDs are often transmitted during sexual activity if one person is infected. However, they may also be transmitted through nonsexual means. STDs may be transmitted after:  °· Sexual intercourse with an infected person. °· Sharing sex toys with an infected person. °· Sharing needles with an infected person or using unclean piercing or tattoo needles. °· Having intimate contact with the genitals, mouth, or rectal areas of an infected person. °· Exposure to infected fluids during birth. °WHAT ARE THE SIGNS AND SYMPTOMS OF STDs? °Different STDs have different symptoms. Some people may not have any symptoms. If symptoms are present, they may include: °· Painful or bloody urination. °· Pain in the pelvis, abdomen, vagina, anus, throat, or eyes. °· A skin rash, itching, or irritation. °· Growths, ulcerations, blisters, or sores in the genital and anal areas. °· Abnormal vaginal discharge with or without bad odor. °· Penile discharge in men. °· Fever. °· Pain or bleeding during sexual intercourse. °· Swollen glands in the groin area. °· Yellow skin and eyes (jaundice). This is seen with hepatitis. °· Swollen testicles. °· Infertility. °· Sores and blisters in the mouth. °HOW ARE STDs DIAGNOSED? °To make a diagnosis, your health care provider may: °· Take a medical history. °· Perform a physical exam. °· Take a sample of any discharge to examine. °· Swab the throat,  cervix, opening to the penis, rectum, or vagina for testing. °· Test a sample of your first morning urine. °· Perform blood tests. °· Perform a Pap test, if this applies. °· Perform a colposcopy. °· Perform a laparoscopy. °HOW ARE STDs TREATED? °Treatment depends on the STD. Some STDs may be treated but not cured. °· Chlamydia, gonorrhea, trichomonas, and syphilis can be cured with antibiotic medicine. °· Genital herpes, hepatitis, and HIV can be treated, but not cured, with prescribed medicines. The medicines lessen symptoms. °· Genital warts from HPV can be treated with medicine or by freezing, burning (electrocautery), or surgery. Warts may come back. °· HPV cannot be cured with medicine or surgery. However, abnormal areas may be removed from the cervix, vagina, or vulva. °· If your diagnosis is confirmed, your recent sexual partners need treatment. This is true even if they are symptom-free or have a negative culture or evaluation. They should not have sex until their health care providers say it is okay. °· Your health care provider may test you for infection again 3 months after treatment. °HOW CAN I REDUCE MY RISK OF GETTING AN STD? °Take these steps to reduce your risk of getting an STD: °· Use latex condoms, dental dams, and water-soluble lubricants during sexual activity. Do not use petroleum jelly or oils. °· Avoid having multiple sex partners. °· Do not have sex with someone who has other sex partners °· Do not have sex with anyone you do not know or who is at high risk for an STD. °· Avoid risky sex practices that can break your skin. °· Do not have sex   if you have open sores on your mouth or skin. °· Avoid drinking too much alcohol or taking illegal drugs. Alcohol and drugs can affect your judgment and put you in a vulnerable position. °· Avoid engaging in oral and anal sex acts. °· Get vaccinated for HPV and hepatitis. If you have not received these vaccines in the past, talk to your health care  provider about whether one or both might be right for you. °· If you are at risk of being infected with HIV, it is recommended that you take a prescription medicine daily to prevent HIV infection. This is called pre-exposure prophylaxis (PrEP). You are considered at risk if: °¨ You are a man who has sex with other men (MSM). °¨ You are a heterosexual man or woman and are sexually active with more than one partner. °¨ You take drugs by injection. °¨ You are sexually active with a partner who has HIV. °· Talk with your health care provider about whether you are at high risk of being infected with HIV. If you choose to begin PrEP, you should first be tested for HIV. You should then be tested every 3 months for as long as you are taking PrEP. °WHAT SHOULD I DO IF I THINK I HAVE AN STD? °· See your health care provider. °· Tell your sexual partner(s). They should be tested and treated for any STDs. °· Do not have sex until your health care provider says it is okay. °WHEN SHOULD I GET IMMEDIATE MEDICAL CARE? °Contact your health care provider right away if:  °· You have severe abdominal pain. °· You are a man and notice swelling or pain in your testicles. °· You are a woman and notice swelling or pain in your vagina. °  °This information is not intended to replace advice given to you by your health care provider. Make sure you discuss any questions you have with your health care provider. °  °Document Released: 11/03/2002 Document Revised: 09/03/2014 Document Reviewed: 03/03/2013 °Elsevier Interactive Patient Education ©2016 [employer]vier [employer]. ° °

## 2016-02-02 LAB — HIV ANTIBODY (ROUTINE TESTING W REFLEX): HIV 1&2 Ab, 4th Generation: NONREACTIVE

## 2016-02-02 LAB — GC/CHLAMYDIA PROBE AMP (~~LOC~~) NOT AT ARMC
CHLAMYDIA, DNA PROBE: POSITIVE — AB
NEISSERIA GONORRHEA: POSITIVE — AB

## 2016-02-02 LAB — RPR

## 2016-02-02 LAB — CYTOLOGY - PAP

## 2016-02-02 LAB — HEPATITIS B SURFACE ANTIGEN: HEP B S AG: NEGATIVE

## 2016-02-02 LAB — HEPATITIS C ANTIBODY: HCV Ab: NEGATIVE

## 2016-02-03 ENCOUNTER — Ambulatory Visit (INDEPENDENT_AMBULATORY_CARE_PROVIDER_SITE_OTHER): Payer: BLUE CROSS/BLUE SHIELD

## 2016-02-03 VITALS — BP 122/69 | HR 95

## 2016-02-03 DIAGNOSIS — A5602 Chlamydial vulvovaginitis: Secondary | ICD-10-CM | POA: Diagnosis not present

## 2016-02-03 DIAGNOSIS — A5402 Gonococcal vulvovaginitis, unspecified: Secondary | ICD-10-CM | POA: Diagnosis not present

## 2016-02-03 DIAGNOSIS — Z113 Encounter for screening for infections with a predominantly sexual mode of transmission: Secondary | ICD-10-CM

## 2016-02-03 MED ORDER — CEFTRIAXONE SODIUM 250 MG IJ SOLR: 250.0000 mg | Freq: Once | INTRAMUSCULAR | Status: DC

## 2016-02-03 MED ORDER — AZITHROMYCIN 250 MG PO TABS
1000.0000 mg | ORAL_TABLET | Freq: Once | ORAL | Status: AC
  Administered 2016-02-03: 1000 mg via ORAL

## 2016-02-03 NOTE — Progress Notes (Signed)
Pt presented today for treatment off. Pt was given Rocephin and Azithromycin. Pt tolerated well and will follow up as needed for gyn

## 2016-05-03 ENCOUNTER — Emergency Department (HOSPITAL_COMMUNITY)
Admission: EM | Admit: 2016-05-03 | Discharge: 2016-05-03 | Disposition: A | Discharge status: Home / Self Care | Payer: BLUE CROSS/BLUE SHIELD | Attending: Emergency Medicine | Admitting: Emergency Medicine

## 2016-05-03 ENCOUNTER — Emergency Department (HOSPITAL_COMMUNITY): Payer: BLUE CROSS/BLUE SHIELD

## 2016-05-03 ENCOUNTER — Encounter (HOSPITAL_COMMUNITY): Payer: Self-pay | Admitting: Emergency Medicine

## 2016-05-03 DIAGNOSIS — K529 Noninfective gastroenteritis and colitis, unspecified: Secondary | ICD-10-CM | POA: Diagnosis not present

## 2016-05-03 DIAGNOSIS — R1010 Upper abdominal pain, unspecified: Secondary | ICD-10-CM | POA: Diagnosis present

## 2016-05-03 LAB — COMPREHENSIVE METABOLIC PANEL
ALT: 12 U/L — AB (ref 14–54)
ANION GAP: 7 (ref 5–15)
AST: 13 U/L — ABNORMAL LOW (ref 15–41)
Albumin: 4.1 g/dL (ref 3.5–5.0)
Alkaline Phosphatase: 54 U/L (ref 38–126)
BUN: 9 mg/dL (ref 6–20)
CHLORIDE: 110 mmol/L (ref 101–111)
CO2: 24 mmol/L (ref 22–32)
Calcium: 10 mg/dL (ref 8.9–10.3)
Creatinine, Ser: 0.76 mg/dL (ref 0.44–1.00)
Glucose, Bld: 89 mg/dL (ref 65–99)
Potassium: 4.2 mmol/L (ref 3.5–5.1)
SODIUM: 141 mmol/L (ref 135–145)
Total Bilirubin: 1.5 mg/dL — ABNORMAL HIGH (ref 0.3–1.2)
Total Protein: 7.1 g/dL (ref 6.5–8.1)

## 2016-05-03 LAB — LIPASE, BLOOD: LIPASE: 33 U/L (ref 11–51)

## 2016-05-03 LAB — CBC
HCT: 43.9 % (ref 36.0–46.0)
HEMOGLOBIN: 14.4 g/dL (ref 12.0–15.0)
MCH: 30.4 pg (ref 26.0–34.0)
MCHC: 32.8 g/dL (ref 30.0–36.0)
MCV: 92.6 fL (ref 78.0–100.0)
PLATELETS: 298 10*3/uL (ref 150–400)
RBC: 4.74 MIL/uL (ref 3.87–5.11)
RDW: 12.9 % (ref 11.5–15.5)
WBC: 10.4 10*3/uL (ref 4.0–10.5)

## 2016-05-03 LAB — I-STAT BETA HCG BLOOD, ED (MC, WL, AP ONLY)

## 2016-05-03 MED ORDER — SODIUM CHLORIDE 0.9 % IV BOLUS (SEPSIS)
1000.0000 mL | Freq: Once | INTRAVENOUS | Status: AC
  Administered 2016-05-03: 1000 mL via INTRAVENOUS

## 2016-05-03 MED ORDER — ONDANSETRON HCL 4 MG/2ML IJ SOLN
4.0000 mg | Freq: Once | INTRAMUSCULAR | Status: AC
  Administered 2016-05-03: 4 mg via INTRAVENOUS
  Filled 2016-05-03: qty 2

## 2016-05-03 MED ORDER — PROMETHAZINE HCL 25 MG PO TABS: 25.0000 mg | ORAL_TABLET | Freq: Four times a day (QID) | ORAL | 0 refills | Status: DC | PRN

## 2016-05-03 MED ORDER — SUCRALFATE 1 G PO TABS: 1.0000 g | ORAL_TABLET | Freq: Three times a day (TID) | ORAL | 0 refills | Status: DC

## 2016-05-03 NOTE — ED Provider Notes (Signed)
[facility]-EMERGENCY DEPT Provider Note   CSN: [id number] Arrival date & time: 05/03/16  1221     History   Chief Complaint Chief Complaint  Patient presents with  . Abdominal Pain  . Emesis  . Diarrhea    HPI Jacqueline Erickson is a 25 y.o. female.  HPI Patient presents to the emergency department with abdominal discomfort with nausea, vomiting, diarrhea.  Patient states this started 2 days ago.  She states that nothing seems make the condition better or worse.  She states she did not take any medications prior to arrival.  Patient states that abdominal pain is mostly upper abdominal pain. The patient denies chest pain, shortness of breath, headache,blurred vision, neck pain, fever, cough, weakness, numbness, dizziness, anorexia, edema,  rash, back pain, dysuria, hematemesis, bloody stool, near syncope, or syncope. Past Medical History:  Diagnosis Date  . Depression   . Migraine     There are no active problems to display for this patient.   History reviewed. No pertinent surgical history.  OB History    Gravida Para Term Preterm AB Living   0 0 0 0 0 0   SAB TAB Ectopic Multiple Live Births   0 0 0 0         Home Medications    Prior to Admission medications   Medication Sig Start Date End Date Taking? Authorizing Provider  brompheniramine-pseudoephedrine-DM 30-2-10 MG/5ML syrup Take 10 mLs by mouth 4 (four) times daily as needed. Patient not taking: Reported on 11/03/2015 06/24/15   Linna Hoff, MD  cefTRIAXone (ROCEPHIN) 250 MG injection Inject 250 mg into the muscle once.  FOR IM use in LARGE MUSCLE MASS 02/03/16   Levie Heritage, DO  cyclobenzaprine (FLEXERIL) 10 MG tablet Take 1 tablet (10 mg total) by mouth 3 (three) times daily as needed for muscle spasms. Patient not taking: Reported on 11/03/2015 01/09/15   Charm Rings, MD  dicyclomine (BENTYL) 20 MG tablet Take 1 tablet (20 mg total) by mouth 2 (two) times daily. Patient not taking: Reported on 11/03/2015 04/26/15    Arthor Captain, PA-C  etonogestrel (NEXPLANON) 68 MG IMPL implant Inject 1 each into the skin once. Reported on 11/03/2015    Historical Provider, MD  fluticasone (FLONASE) 50 MCG/ACT nasal spray Place 2 sprays into both nostrils daily. Patient not taking: Reported on 11/03/2015 07/29/15   Danelle Berry, PA-C  HYDROcodone-acetaminophen (NORCO) 5-325 MG per tablet Take 0.5-1 tablets by mouth every 4 (four) hours as needed. Patient not taking: Reported on 11/03/2015 04/26/15   Arthor Captain, PA-C  ibuprofen (ADVIL,MOTRIN) 800 MG tablet Take 1 tablet (800 mg total) by mouth 3 (three) times daily. Patient not taking: Reported on 11/03/2015 07/31/15   Renne Crigler, PA-C  ipratropium (ATROVENT) 0.06 % nasal spray Place 2 sprays into both nostrils 4 (four) times daily. Patient not taking: Reported on 11/03/2015 06/24/15   Linna Hoff, MD  omeprazole (PRILOSEC) 20 MG capsule Take 1 capsule (20 mg total) by mouth daily. Patient not taking: Reported on 11/03/2015 04/26/15   Arthor Captain, PA-C  ondansetron (ZOFRAN) 4 MG tablet Take 1 tablet (4 mg total) by mouth every 6 (six) hours. Patient not taking: Reported on 11/03/2015 04/25/15   Hayden Rasmussen, NP  pseudoephedrine (SUDAFED) 30 MG tablet Take 2 tablets (60 mg total) by mouth every 4 (four) hours as needed for congestion. Patient not taking: Reported on 11/03/2015 07/29/15   Danelle Berry, PA-C  sucralfate (CARAFATE) 1 G tablet Take 1  tablet (1 g total) by mouth 4 (four) times daily -  with meals and at bedtime. Patient not taking: Reported on 11/03/2015 04/25/15   Hayden Rasmussenavid Mabe, NP    Family History Family History  Problem Relation Age of Onset  . Diabetes Mother   . Hypertension Mother   . Diabetes Father   . Hypertension Father   . Hyperlipidemia Father     Social History Social History  Substance Use Topics  . Smoking status: Never Smoker  . Smokeless tobacco: Never Used  . Alcohol use No     Allergies   Augmentin [amoxicillin-pot clavulanate]; Garlic powder  [garlic]; Histamine; and Watermelon flavor   Review of Systems Review of Systems All other systems negative except as documented in the HPI. All pertinent positives and negatives as reviewed in the HPI.  Physical Exam Updated Vital Signs BP 123/78 (BP Location: Right Arm)   Pulse (!) 58   Temp 98 F (36.7 C) (Oral)   Resp 20   Ht 5\' 3"  (1.6 m)   Wt 86.6 kg   LMP 04/06/2016   SpO2 100%   BMI 33.82 kg/m   Physical Exam  Constitutional: She is oriented to person, place, and time. She appears well-developed and well-nourished. No distress.  HENT:  Head: Normocephalic and atraumatic.  Mouth/Throat: Oropharynx is clear and moist.  Eyes: Pupils are equal, round, and reactive to light.  Neck: Normal range of motion. Neck supple.  Cardiovascular: Normal rate, regular rhythm and normal heart sounds.  Exam reveals no gallop and no friction rub.   No murmur heard. Pulmonary/Chest: Effort normal and breath sounds normal. No respiratory distress. She has no wheezes.  Abdominal: Soft. Bowel sounds are normal. She exhibits no distension and no mass. There is tenderness. There is no rebound and no guarding.  Neurological: She is alert and oriented to person, place, and time. She exhibits normal muscle tone. Coordination normal.  Skin: Skin is warm and dry. No rash noted. No erythema.  Psychiatric: She has a normal mood and affect. Her behavior is normal.  Nursing note and vitals reviewed.    ED Treatments / Results  Labs (all labs ordered are listed, but only abnormal results are displayed) Labs Reviewed  COMPREHENSIVE METABOLIC PANEL - Abnormal; Notable for the following:       Result Value   AST 13 (*)    ALT 12 (*)    Total Bilirubin 1.5 (*)    All other components within normal limits  LIPASE, BLOOD  CBC  URINALYSIS, ROUTINE W REFLEX MICROSCOPIC (NOT AT [facility])  I-STAT BETA HCG BLOOD, ED ([facility], [facility], [facility] ONLY)    EKG  EKG Interpretation None       Radiology [place] Abdomen  Limited  Result Date: 05/03/2016 CLINICAL DATA:  RIGHT upper quadrant pain for 2 days with nausea, vomiting, and diarrhea EXAM: US ABDOMEN LIMITED - RIGHT UPPER QUADRANT COMPARISON:  04/26/2015 FINDINGS: Gallbladder: Normally distended without stones or wall thickening. No pericholecystic fluid or sonographic Murphy sign. Common bile duct: Diameter: 3 mm diameter , normal Liver: Normal appearance No RIGHT upper quadrant free fluid IMPRESSION: Normal exam. Electronically Signed   By: Ulyses SouthwardMark  Boles M.D.   On: 05/03/2016 19:00    Procedures Procedures (including critical care time)  Medications Ordered in ED Medications  sodium chloride 0.9 % bolus 1,000 mL (1,000 mLs Intravenous New Bag/Given 05/03/16 1755)  ondansetron (ZOFRAN) injection 4 mg (4 mg Intravenous Given 05/03/16 1803)     Initial Impression /  Assessment and Plan / ED Course  I have reviewed the triage vital signs and the nursing notes.  Pertinent labs & imaging results that were available during my care of the patient were reviewed by me and considered in my medical decision making (see chart for details).  Clinical Course    Patient has negative ultrasound.  I advised the patient that this is mostly gastroenteritis.  Patient agrees the plan and all questions answered.  She feels better following IV fluids and antiemetics.  Told to return here as needed  Final Clinical Impressions(s) / ED Diagnoses   Final diagnoses:  None    New Prescriptions New Prescriptions   No medications on file     Charlestine Night, PA-C 05/04/16 1703    Rolland Porter, MD 05/13/16 [phone]

## 2016-05-03 NOTE — ED Notes (Signed)
After starting IV went into room to give meds for nausea.  Pt eating potato chips

## 2016-05-03 NOTE — ED Triage Notes (Signed)
Pt here with 2 days history of n/v/d with abdominal pain and fatigue. Pt denies fever. LMP 8/11.

## 2016-05-03 NOTE — ED Notes (Signed)
Patient transported to Ultrasound 

## 2016-05-03 NOTE — Discharge Instructions (Signed)
Return here as needed.  Follow-up with your primary care doctor.  Your imaging did not show any abnormalities

## 2016-05-21 ENCOUNTER — Encounter (HOSPITAL_COMMUNITY): Payer: Self-pay | Admitting: Emergency Medicine

## 2016-05-21 ENCOUNTER — Emergency Department (HOSPITAL_COMMUNITY): Payer: BLUE CROSS/BLUE SHIELD

## 2016-05-21 ENCOUNTER — Emergency Department (HOSPITAL_COMMUNITY)
Admission: EM | Admit: 2016-05-21 | Discharge: 2016-05-21 | Disposition: A | Discharge status: Home / Self Care | Payer: BLUE CROSS/BLUE SHIELD | Attending: Emergency Medicine | Admitting: Emergency Medicine

## 2016-05-21 DIAGNOSIS — Y999 Unspecified external cause status: Secondary | ICD-10-CM | POA: Insufficient documentation

## 2016-05-21 DIAGNOSIS — S39012A Strain of muscle, fascia and tendon of lower back, initial encounter: Secondary | ICD-10-CM | POA: Diagnosis not present

## 2016-05-21 DIAGNOSIS — Y939 Activity, unspecified: Secondary | ICD-10-CM | POA: Diagnosis not present

## 2016-05-21 DIAGNOSIS — Y92410 Unspecified street and highway as the place of occurrence of the external cause: Secondary | ICD-10-CM | POA: Insufficient documentation

## 2016-05-21 DIAGNOSIS — S3992XA Unspecified injury of lower back, initial encounter: Secondary | ICD-10-CM | POA: Diagnosis present

## 2016-05-21 DIAGNOSIS — M25561 Pain in right knee: Secondary | ICD-10-CM | POA: Diagnosis not present

## 2016-05-21 MED ORDER — IBUPROFEN 400 MG PO TABS
600.0000 mg | ORAL_TABLET | Freq: Once | ORAL | Status: AC
  Administered 2016-05-21: 600 mg via ORAL
  Filled 2016-05-21: qty 1

## 2016-05-21 MED ORDER — METHOCARBAMOL 500 MG PO TABS: 500.0000 mg | ORAL_TABLET | Freq: Two times a day (BID) | ORAL | 0 refills | Status: DC

## 2016-05-21 MED ORDER — IBUPROFEN 600 MG PO TABS: 600.0000 mg | ORAL_TABLET | Freq: Four times a day (QID) | ORAL | 0 refills | Status: DC | PRN
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
## 2016-05-21 NOTE — Discharge Instructions (Signed)
Take your medications as prescribed as an for pain relief. I recommend resting, elevating and applying ice to right knee for 15 minutes 3-4 times daily to help with pain and swelling. You may continue to apply a knee sleeve or Ace bandage wrap to your right knee to help with pain and swelling. Follow-up with the orthopedic clinic listed above if your right knee pain has not improved over the next week. Please return to the Emergency Department if symptoms worsen or new onset of fever, headache, confusion, difficulty breathing, chest pain, numbness, tingling, weakness.
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
## 2016-05-21 NOTE — Progress Notes (Signed)
Orthopedic Tech Progress Note Patient Details:  Jacqueline Erickson [birth year] [id number]  Ortho Devices Type of Ortho Device: Crutches Ortho Device/Splint Interventions: Application   [name] 05/21/2016, 11:06 AM

## 2016-05-21 NOTE — ED Notes (Signed)
Ortho Tech at bed side for crutch use instruction

## 2016-05-24 ENCOUNTER — Encounter (HOSPITAL_COMMUNITY): Payer: Self-pay | Admitting: Physical Medicine and Rehabilitation

## 2016-05-24 ENCOUNTER — Emergency Department (HOSPITAL_COMMUNITY)
Admission: EM | Admit: 2016-05-24 | Discharge: 2016-05-24 | Disposition: A | Discharge status: Home / Self Care | Payer: BLUE CROSS/BLUE SHIELD | Attending: Emergency Medicine | Admitting: Emergency Medicine

## 2016-05-24 DIAGNOSIS — W2209XD Striking against other stationary object, subsequent encounter: Secondary | ICD-10-CM | POA: Insufficient documentation

## 2016-05-24 DIAGNOSIS — M25561 Pain in right knee: Secondary | ICD-10-CM | POA: Diagnosis not present

## 2016-05-24 MED ORDER — HYDROCODONE-ACETAMINOPHEN 5-325 MG PO TABS
1.0000 | ORAL_TABLET | Freq: Once | ORAL | Status: AC
  Administered 2016-05-24: 1 via ORAL
  Filled 2016-05-24: qty 1

## 2016-05-24 MED ORDER — TRAMADOL HCL 50 MG PO TABS: 50.0000 mg | ORAL_TABLET | Freq: Four times a day (QID) | ORAL | 0 refills | Status: DC | PRN
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
## 2016-05-24 NOTE — Discharge Instructions (Signed)
Follow up with the orthopedic doctor as scheduled.

## 2016-05-24 NOTE — Progress Notes (Signed)
Orthopedic Tech Progress Note Patient Details:  Jacqueline Erickson 1991-03-15 [id number]  Ortho Devices Type of Ortho Device: Knee Immobilizer Ortho Device/Splint Location: RLE Ortho Device/Splint Interventions: Ordered, Application   Jennye MoccasinHughes, Jacqueline Berko Craig 05/24/2016, 8:21 PM

## 2016-08-16 ENCOUNTER — Ambulatory Visit (INDEPENDENT_AMBULATORY_CARE_PROVIDER_SITE_OTHER): Payer: BLUE CROSS/BLUE SHIELD | Admitting: Adult Health

## 2016-08-16 ENCOUNTER — Encounter: Payer: Self-pay | Admitting: Adult Health

## 2016-08-16 VITALS — BP 112/70 | Temp 98.6°F | Ht 62.0 in | Wt 197.4 lb

## 2016-08-16 DIAGNOSIS — Z6836 Body mass index (BMI) 36.0-36.9, adult: Secondary | ICD-10-CM

## 2016-08-16 DIAGNOSIS — F17200 Nicotine dependence, unspecified, uncomplicated: Secondary | ICD-10-CM | POA: Diagnosis not present

## 2016-08-16 DIAGNOSIS — E6609 Other obesity due to excess calories: Secondary | ICD-10-CM

## 2016-08-16 DIAGNOSIS — Z7689 Persons encountering health services in other specified circumstances: Secondary | ICD-10-CM

## 2016-08-16 MED ORDER — VARENICLINE TARTRATE 1 MG PO TABS: 1.0000 mg | ORAL_TABLET | Freq: Two times a day (BID) | ORAL | 2 refills | Status: DC

## 2016-08-16 MED ORDER — VARENICLINE TARTRATE 0.5 MG X 11 & 1 MG X 42 PO MISC
ORAL | 0 refills | Status: DC
Start: 2016-08-16 — End: 2017-07-24

## 2016-08-16 NOTE — Patient Instructions (Signed)
It was great meeting you today   Please follow up with me in one month to see how you are doing to quit smoking   Continue to work on diet and exercise.   Please let me know if you need anything

## 2016-08-16 NOTE — Progress Notes (Signed)
Patient presents to clinic today to establish care. She is a pleasant 25 year old female who  has a past medical history of Depression and Migraine.   Acute Concerns:   Chronic Issues: Migraines- She gets migraines once a month. When she was in high school she was getting one or two a week. She takes Motrin when needed for her migraines. She feels as though this works well for her.   Depression  - No longer has an issue with this. This was more during middle school and high school   Tobacco Use- She has been cutting back and is down to 0.25 pack/day. She was smoking close to one pack. She would like additional help with quitting smoking   Health Maintenance: Dental -- Routine  Vision -- Routine  Immunizations -- Needs flu  PAP -- May 2017- No abnormal  Diet: Does not follow a specific diet.  Exercise: She walks 30 minutes a day.    Is followed by  - [facility]   Past Medical History:  Diagnosis Date  . Depression   . Migraine     No past surgical history on file.  Current Outpatient Prescriptions on File Prior to Visit  Medication Sig Dispense Refill  . etonogestrel (NEXPLANON) 68 MG IMPL implant Inject 1 each into the skin once. Reported on 11/03/2015     No current facility-administered medications on file prior to visit.     Allergies  Allergen Reactions  . Augmentin [Amoxicillin-Pot Clavulanate]     hives  . Garlic Powder [Garlic] Hives  . Histamine Hives  . Watermelon Flavor Hives    "watermelon fruit"    Family History  Problem Relation Age of Onset  . Diabetes Mother   . Hypertension Mother   . Diabetes Father   . Hypertension Father   . Hyperlipidemia Father     Social History   Social History  . Marital status: Single    Spouse name: N/A  . Number of children: N/A  . Years of education: N/A   Occupational History  . Not on file.   Social History Main Topics  . Smoking status: Never Smoker  . Smokeless tobacco: Never Used    . Alcohol use No  . Drug use: No  . Sexual activity: Yes    Birth control/ protection: Implant   Other Topics Concern  . Not on file   Social History Narrative  . No narrative on file    Review of Systems  Constitutional: Negative.   HENT: Negative.   Eyes: Negative.   Respiratory: Negative.   Cardiovascular: Negative.   Gastrointestinal: Negative.   Genitourinary: Negative.   Musculoskeletal: Negative.   Skin: Negative.   Neurological: Negative.   Endo/Heme/Allergies: Negative.   Psychiatric/Behavioral: Negative.   All other systems reviewed and are negative.   BP 112/70   Temp 98.6 F (37 C) (Oral)   Ht 5\' 2"  (1.575 m)   Wt 197 lb 6.4 oz (89.5 kg)   BMI 36.10 kg/m   Physical Exam  Constitutional: She is oriented to person, place, and time and well-developed, well-nourished, and in no distress. No distress.  Eyes: Conjunctivae and EOM are normal. Pupils are equal, round, and reactive to light. Right eye exhibits no discharge. Left eye exhibits no discharge. No scleral icterus.  Neck: Normal range of motion. Neck supple. No JVD present. No tracheal deviation present. No thyromegaly present.  Cardiovascular: Normal rate, regular rhythm, normal heart sounds and intact  distal pulses.  Exam reveals no gallop and no friction rub.   No murmur heard. Pulmonary/Chest: Effort normal and breath sounds normal. No stridor. No respiratory distress. She has no wheezes. She has no rales. She exhibits no tenderness.  Musculoskeletal: Normal range of motion. She exhibits no edema, tenderness or deformity.  Lymphadenopathy:    She has no cervical adenopathy.  Neurological: She is alert and oriented to person, place, and time. She displays normal reflexes. No cranial nerve deficit. She exhibits normal muscle tone. Gait normal. Coordination normal. GCS score is 15.  Skin: Skin is warm and dry. No rash noted. She is not diaphoretic. No erythema. No pallor.  Psychiatric: Mood, memory,  affect and judgment normal.  Nursing note and vitals reviewed.   Assessment/Plan: 1. Encounter to establish care - Follow up for CPE or sooner if needed - Continue to work on weight loss through diet and exercise  2. Tobacco use disorder Trial of chantix. Common side effects including rare risk of suicide ideation was discussed with the patient today.  Patient is instructed to go directly to the ED if this occurs.  We discussed that patient can continue to smoke for 1 week after starting chantix, but then must discontinue cigarettes.  5 minutes spent with patient today on tobacco cessation counseling.  - varenicline (CHANTIX CONTINUING MONTH PAK) 1 MG tablet; Take 1 tablet (1 mg total) by mouth 2 (two) times daily.  Dispense: 60 tablet; Refill: 2 - varenicline (CHANTIX STARTING MONTH PAK) 0.5 MG X 11 & 1 MG X 42 tablet; Take one 0.5 mg tablet by mouth once daily for 3 days, then increase to one 0.5 mg tablet twice daily for 4 days, then increase to one 1 mg tablet twice daily.  Dispense: 53 tablet; Refill: 0 - Follow up in one month   3. Class 2 obesity due to excess calories without serious comorbidity with body mass index (BMI) of 36.0 to 36.9 in adult - Continue to work on diet and start exercising on a daily basis.  - Follow up as needed  Shirline Frees, NP

## 2016-09-18 ENCOUNTER — Ambulatory Visit: Payer: BLUE CROSS/BLUE SHIELD | Admitting: Adult Health

## 2017-01-31 ENCOUNTER — Encounter (HOSPITAL_COMMUNITY): Payer: Self-pay | Admitting: Emergency Medicine

## 2017-01-31 ENCOUNTER — Ambulatory Visit (HOSPITAL_COMMUNITY)
Admission: EM | Admit: 2017-01-31 | Discharge: 2017-01-31 | Disposition: A | Discharge status: Home / Self Care | Payer: BLUE CROSS/BLUE SHIELD | Attending: Internal Medicine | Admitting: Internal Medicine

## 2017-01-31 DIAGNOSIS — J069 Acute upper respiratory infection, unspecified: Secondary | ICD-10-CM

## 2017-01-31 DIAGNOSIS — B9789 Other viral agents as the cause of diseases classified elsewhere: Secondary | ICD-10-CM

## 2017-01-31 MED ORDER — AZITHROMYCIN 250 MG PO TABS: 250.0000 mg | ORAL_TABLET | Freq: Every day | ORAL | 0 refills | Status: DC

## 2017-01-31 MED ORDER — IPRATROPIUM BROMIDE 0.06 % NA SOLN: 2.0000 | Freq: Four times a day (QID) | NASAL | 0 refills | Status: DC

## 2017-01-31 MED ORDER — BENZONATATE 100 MG PO CAPS: 100.0000 mg | ORAL_CAPSULE | Freq: Three times a day (TID) | ORAL | 0 refills | Status: DC

## 2017-01-31 NOTE — ED Provider Notes (Signed)
CSN: [id number]     Arrival date & time 01/31/17  1715 History   First MD Initiated Contact with Patient 01/31/17 1809     Chief Complaint  Patient presents with  . URI   (Consider location/radiation/quality/duration/timing/severity/associated sxs/prior Treatment) Patient c/o uri sx's for 3 days.   The history is provided by the patient.  URI  Presenting symptoms: congestion, cough and fatigue   Severity:  Moderate Onset quality:  Sudden Duration:  3 days Timing:  Constant Chronicity:  New Relieved by:  Nothing Worsened by:  Nothing Ineffective treatments:  None tried   Past Medical History:  Diagnosis Date  . Depression   . Migraine    History reviewed. No pertinent surgical history. Family History  Problem Relation Age of Onset  . Diabetes Mother   . Hypertension Mother   . Depression Mother   . Anxiety disorder Mother   . Diabetes Father   . Hypertension Father   . Hyperlipidemia Father   . Personality disorder Father   . Cancer Maternal Grandmother        Appendix?  . Cancer Maternal Grandfather        Skin ?   Social History  Substance Use Topics  . Smoking status: Current Every Day Smoker    Packs/day: 0.25    Types: Cigarettes  . Smokeless tobacco: Never Used  . Alcohol use No   OB History    Gravida Para Term Preterm AB Living   0 0 0 0 0 0   SAB TAB Ectopic Multiple Live Births   0 0 0 0       Review of Systems  Constitutional: Positive for fatigue.  HENT: Positive for congestion.   Eyes: Negative.   Respiratory: Positive for cough.   Cardiovascular: Negative.   Gastrointestinal: Negative.   Endocrine: Negative.   Genitourinary: Negative.   Musculoskeletal: Negative.   Allergic/Immunologic: Negative.   Neurological: Negative.   Hematological: Negative.   Psychiatric/Behavioral: Negative.     Allergies  Augmentin [amoxicillin-pot clavulanate]; Garlic powder [garlic]; Histamine; and Watermelon flavor  Home Medications   Prior to  Admission medications   Medication Sig Start Date End Date Taking? Authorizing Provider  etonogestrel (NEXPLANON) 68 MG IMPL implant Inject 1 each into the skin once. Reported on 11/03/2015   Yes [provider]  azithromycin (ZITHROMAX) 250 MG tablet Take 1 tablet (250 mg total) by mouth daily. Take first 2 tablets together, then 1 every day until finished. 01/31/17   Deatra Canter, FNP  benzonatate (TESSALON) 100 MG capsule Take 1 capsule (100 mg total) by mouth every 8 (eight) hours. 01/31/17   Deatra Canter, FNP  ipratropium (ATROVENT) 0.06 % nasal spray Place 2 sprays into both nostrils 4 (four) times daily. 01/31/17   Deatra Canter, FNP  varenicline (CHANTIX CONTINUING MONTH PAK) 1 MG tablet Take 1 tablet (1 mg total) by mouth 2 (two) times daily. 08/16/16   Nafziger, Kandee Keen, NP  varenicline (CHANTIX STARTING MONTH PAK) 0.5 MG X 11 & 1 MG X 42 tablet Take one 0.5 mg tablet by mouth once daily for 3 days, then increase to one 0.5 mg tablet twice daily for 4 days, then increase to one 1 mg tablet twice daily. 08/16/16   Nafziger, Kandee Keen, NP   Meds Ordered and Administered this Visit  Medications - No data to display  BP (!) 140/93 (BP Location: Left Arm)   Pulse 79   Temp 98.5 F (36.9 C) (Oral)   Resp  20   LMP 01/19/2017   SpO2 100%  No data found.   Physical Exam  Constitutional: She is oriented to person, place, and time. She appears well-developed and well-nourished.  HENT:  Head: Normocephalic and atraumatic.  Right Ear: External ear normal.  Left Ear: External ear normal.  Mouth/Throat: Oropharynx is clear and moist.  Eyes: Conjunctivae and EOM are normal. Pupils are equal, round, and reactive to light.  Neck: Normal range of motion. Neck supple.  Cardiovascular: Normal rate, regular rhythm and normal heart sounds.   Pulmonary/Chest: Effort normal and breath sounds normal.  Abdominal: Soft. Bowel sounds are normal.  Musculoskeletal: Normal range of motion.   Neurological: She is alert and oriented to person, place, and time.  Nursing note and vitals reviewed.   Urgent Care Course     Procedures (including critical care time)  Labs Review Labs Reviewed - No data to display  Imaging Review No results found.   Visual Acuity Review  Right Eye Distance:   Left Eye Distance:   Bilateral Distance:    Right Eye Near:   Left Eye Near:    Bilateral Near:         MDM   1. Viral URI with cough    Zpak Tessalon perles Atrovent nasal spray  Push po fluids, rest, tylenol and motrin otc prn as directed for fever, arthralgias, and myalgias.  Follow up prn if sx's continue or persist.    Deatra Canter, FNP 01/31/17 [phone]

## 2017-01-31 NOTE — ED Triage Notes (Signed)
Pt here for cold sx onset 3 days associated w/BAs, ST, prod cough, HA  Denies fevers, chills  Taking OTC cold meds w/no relief  A&O x4... NAD... Ambulatory

## 2017-02-03 ENCOUNTER — Emergency Department (HOSPITAL_COMMUNITY)
Admission: EM | Admit: 2017-02-03 | Discharge: 2017-02-03 | Disposition: A | Discharge status: Home / Self Care | Payer: BLUE CROSS/BLUE SHIELD | Attending: Emergency Medicine | Admitting: Emergency Medicine

## 2017-02-03 ENCOUNTER — Encounter (HOSPITAL_COMMUNITY): Payer: Self-pay | Admitting: Emergency Medicine

## 2017-02-03 DIAGNOSIS — Z79899 Other long term (current) drug therapy: Secondary | ICD-10-CM | POA: Diagnosis not present

## 2017-02-03 DIAGNOSIS — J00 Acute nasopharyngitis [common cold]: Secondary | ICD-10-CM | POA: Diagnosis not present

## 2017-02-03 DIAGNOSIS — F17210 Nicotine dependence, cigarettes, uncomplicated: Secondary | ICD-10-CM | POA: Insufficient documentation

## 2017-02-03 DIAGNOSIS — R05 Cough: Secondary | ICD-10-CM | POA: Diagnosis present

## 2017-02-03 NOTE — Discharge Instructions (Signed)
You may take over-the-counter medicine for symptomatic relief, such as Tylenol, Motrin, TheraFlu, Alka seltzer , black elderberry, etc. Please limit acetaminophen (Tylenol) to 4000 mg and Ibuprofen (Motrin, Advil, etc.) to 2400 mg for a 24hr period. Please note that other over-the-counter medicine may contain acetaminophen or ibuprofen as a component of their ingredients.   

## 2017-02-03 NOTE — ED Notes (Signed)
Patient is coughing. Patient stated she had to take a breathing treatment this morning due to her starting to have trouble breathing. Patient states she started her z pak today due to doctor told her to take benzonate and Flonase. She was told to take the zpak when the other medication did not work.

## 2017-02-03 NOTE — ED Provider Notes (Signed)
[facility]-EMERGENCY DEPT Provider Note   CSN: [id number] Arrival date & time: 02/03/17  1843     History   Chief Complaint Chief Complaint  Patient presents with  . Cough    HPI Jacqueline Erickson is a 26 y.o. female.   Cough  This is a new problem. Episode onset: 5 days. The problem occurs hourly. The problem has not changed since onset.The cough is productive of sputum. There has been no fever. Associated symptoms include rhinorrhea, sore throat and shortness of breath (improved with albuterol neb). Pertinent negatives include no chest pain and no chills. Associated symptoms comments: Nasal congestion. She has tried decongestants and cough syrup for the symptoms.    Past Medical History:  Diagnosis Date  . Depression   . Migraine     There are no active problems to display for this patient.   History reviewed. No pertinent surgical history.  OB History    Gravida Para Term Preterm AB Living   0 0 0 0 0 0   SAB TAB Ectopic Multiple Live Births   0 0 0 0         Home Medications    Prior to Admission medications   Medication Sig Start Date End Date Taking? Authorizing Provider  azithromycin (ZITHROMAX) 250 MG tablet Take 1 tablet (250 mg total) by mouth daily. Take first 2 tablets together, then 1 every day until finished. 01/31/17   Deatra Canter, FNP  benzonatate (TESSALON) 100 MG capsule Take 1 capsule (100 mg total) by mouth every 8 (eight) hours. 01/31/17   Deatra Canter, FNP  etonogestrel (NEXPLANON) 68 MG IMPL implant Inject 1 each into the skin once. Reported on 11/03/2015    [provider]  ipratropium (ATROVENT) 0.06 % nasal spray Place 2 sprays into both nostrils 4 (four) times daily. 01/31/17   Deatra Canter, FNP  varenicline (CHANTIX CONTINUING MONTH PAK) 1 MG tablet Take 1 tablet (1 mg total) by mouth 2 (two) times daily. 08/16/16   Nafziger, Kandee Keen, NP  varenicline (CHANTIX STARTING MONTH PAK) 0.5 MG X 11 & 1 MG X 42 tablet Take one 0.5 mg  tablet by mouth once daily for 3 days, then increase to one 0.5 mg tablet twice daily for 4 days, then increase to one 1 mg tablet twice daily. 08/16/16   Shirline Frees, NP    Family History Family History  Problem Relation Age of Onset  . Diabetes Mother   . Hypertension Mother   . Depression Mother   . Anxiety disorder Mother   . Diabetes Father   . Hypertension Father   . Hyperlipidemia Father   . Personality disorder Father   . Cancer Maternal Grandmother        Appendix?  . Cancer Maternal Grandfather        Skin ?    Social History Social History  Substance Use Topics  . Smoking status: Current Every Day Smoker    Packs/day: 0.25    Types: Cigarettes  . Smokeless tobacco: Never Used  . Alcohol use No     Allergies   Augmentin [amoxicillin-pot clavulanate]; Garlic powder [garlic]; Histamine; and Watermelon flavor   Review of Systems Review of Systems  Constitutional: Negative for chills.  HENT: Positive for rhinorrhea and sore throat.   Respiratory: Positive for cough and shortness of breath (improved with albuterol neb).   Cardiovascular: Negative for chest pain.   All other systems are reviewed and are negative for acute change except  as noted in the HPI   Physical Exam Updated Vital Signs BP 113/79 (BP Location: Left Arm)   Pulse 95   Temp 99.2 F (37.3 C) (Oral)   Resp 18   LMP 01/19/2017   SpO2 100%   Physical Exam  Constitutional: She is oriented to person, place, and time. She appears well-developed and well-nourished. No distress.  HENT:  Head: Normocephalic and atraumatic.  Nose: Nose normal.  Eyes: Conjunctivae and EOM are normal. Pupils are equal, round, and reactive to light. Right eye exhibits no discharge. Left eye exhibits no discharge. No scleral icterus.  Neck: Normal range of motion. Neck supple.  Cardiovascular: Normal rate and regular rhythm.  Exam reveals no gallop and no friction rub.   No murmur heard. Pulmonary/Chest:  Effort normal and breath sounds normal. No stridor. No respiratory distress. She has no rales.  Abdominal: Soft. She exhibits no distension. There is no tenderness.  Musculoskeletal: She exhibits no edema or tenderness.  Neurological: She is alert and oriented to person, place, and time.  Skin: Skin is warm and dry. No rash noted. She is not diaphoretic. No erythema.  Psychiatric: She has a normal mood and affect.  Vitals reviewed.    ED Treatments / Results  Labs (all labs ordered are listed, but only abnormal results are displayed) Labs Reviewed - No data to display  EKG  EKG Interpretation None       Radiology No results found.  Procedures Procedures (including critical care time)  Medications Ordered in ED Medications - No data to display   Initial Impression / Assessment and Plan / ED Course  I have reviewed the triage vital signs and the nursing notes.  Pertinent labs & imaging results that were available during my care of the patient were reviewed by me and considered in my medical decision making (see chart for details).     26 y.o. female presents with cough, rhinorrhea, nasal congestion for 5 days. adequate oral hydration. Rest of history as above.  Patient appears well. No signs of toxicity, patient is interactive and playful. No hypoxia, tachypnea or other signs of respiratory distress. No sign of clinical dehydration. Lung exam clear. Rest of exam as above.  Most consistent with viral upper respiratory infection.   No evidence suggestive of pharyngitis, AOM, PNA, or meningitis.  Chest x-ray not indicated at this time.  Discussed symptomatic treatment with the parents and they will follow closely with their PCP.      Final Clinical Impressions(s) / ED Diagnoses   Final diagnoses:  Acute nasopharyngitis   Disposition: Discharge  Condition: Good  I have discussed the results, Dx and Tx plan with the patient who expressed understanding and  agree(s) with the plan. Discharge instructions discussed at great length. The patient was given strict return precautions who verbalized understanding of the instructions. No further questions at time of discharge.    New Prescriptions   No medications on file    Follow Up: Shirline Frees, NP [address] [place] [address] [phone]  Schedule an appointment as soon as possible for a visit  As needed      Cardama, Amadeo Garnet, MD 02/03/17 [phone]

## 2017-02-03 NOTE — ED Triage Notes (Addendum)
Pt from home with complaints of productive cough that began on Wed. Pt has reports of green sputum. Pt states she has 4/10 chest wall pain when she coughs and takes a deep breath. Pt was seen at urgent care on 6/7 for same and was told it is a URI and was given a Z-pack. Pt has clear lung sounds

## 2017-02-18 ENCOUNTER — Encounter (HOSPITAL_COMMUNITY): Payer: Self-pay | Admitting: Emergency Medicine

## 2017-02-18 DIAGNOSIS — G43909 Migraine, unspecified, not intractable, without status migrainosus: Secondary | ICD-10-CM | POA: Insufficient documentation

## 2017-02-18 DIAGNOSIS — F17210 Nicotine dependence, cigarettes, uncomplicated: Secondary | ICD-10-CM | POA: Diagnosis not present

## 2017-02-18 DIAGNOSIS — R51 Headache: Secondary | ICD-10-CM | POA: Diagnosis present

## 2017-02-18 NOTE — ED Triage Notes (Addendum)
Pt from home wit complaints of a migraine x 2 days. Pt reports sensitivity to light but denies changes in vision. Pt states she also has facial pain when she touches her face or when there is air movement on her face. Pt reports pain is throbbing in temple area. Pt rates pain 7/10

## 2017-02-19 ENCOUNTER — Emergency Department (HOSPITAL_COMMUNITY)
Admission: EM | Admit: 2017-02-19 | Discharge: 2017-02-19 | Disposition: A | Discharge status: Home / Self Care | Payer: BLUE CROSS/BLUE SHIELD | Attending: Emergency Medicine | Admitting: Emergency Medicine

## 2017-02-19 DIAGNOSIS — G43009 Migraine without aura, not intractable, without status migrainosus: Secondary | ICD-10-CM

## 2017-02-19 MED ORDER — SUMATRIPTAN SUCCINATE 100 MG PO TABS: ORAL_TABLET | ORAL | 0 refills | Status: DC

## 2017-02-19 MED ORDER — DIPHENHYDRAMINE HCL 50 MG/ML IJ SOLN
25.0000 mg | Freq: Once | INTRAMUSCULAR | Status: AC
  Administered 2017-02-19: 25 mg via INTRAVENOUS

## 2017-02-19 MED ORDER — PROCHLORPERAZINE EDISYLATE 5 MG/ML IJ SOLN
10.0000 mg | Freq: Once | INTRAMUSCULAR | Status: AC
  Administered 2017-02-19: 10 mg via INTRAVENOUS

## 2017-02-19 MED ORDER — PROCHLORPERAZINE EDISYLATE 5 MG/ML IJ SOLN
10.0000 mg | Freq: Once | INTRAMUSCULAR | Status: DC
  Filled 2017-02-19: qty 2

## 2017-02-19 MED ORDER — KETOROLAC TROMETHAMINE 60 MG/2ML IM SOLN
60.0000 mg | Freq: Once | INTRAMUSCULAR | Status: DC
  Filled 2017-02-19: qty 2

## 2017-02-19 MED ORDER — DEXAMETHASONE SODIUM PHOSPHATE 10 MG/ML IJ SOLN
10.0000 mg | Freq: Once | INTRAMUSCULAR | Status: DC
  Filled 2017-02-19: qty 1

## 2017-02-19 MED ORDER — KETOROLAC TROMETHAMINE 30 MG/ML IJ SOLN
30.0000 mg | Freq: Once | INTRAMUSCULAR | Status: AC
  Administered 2017-02-19: 30 mg via INTRAVENOUS

## 2017-02-19 MED ORDER — DEXAMETHASONE SODIUM PHOSPHATE 10 MG/ML IJ SOLN
10.0000 mg | Freq: Once | INTRAMUSCULAR | Status: AC
  Administered 2017-02-19: 10 mg via INTRAVENOUS

## 2017-02-19 MED ORDER — DIPHENHYDRAMINE HCL 50 MG/ML IJ SOLN
25.0000 mg | Freq: Once | INTRAMUSCULAR | Status: DC
  Filled 2017-02-19: qty 1

## 2017-02-19 NOTE — ED Provider Notes (Signed)
[facility]-EM[facility] DEPT Provider Note   CSN: [id number] Arrival date & time: 02/18/17  2154  By signing my name below, I, Phillips Climes, attest that this documentation has been prepared under the direction and in the presence of Davyn Elsasser, Canary Brim, * . Electronically Signed: Phillips Climes, Scribe. 02/19/2017. 2:00 AM.  History   Chief Complaint Chief Complaint  Patient presents with  . Migraine  . Facial Pain   HPI Comments Jacqueline Erickson is a 26 y.o. female with a PMHx significant for depression and migraines, who presents to the Emergency Department with complaints of sudden onset headache x2 days.  Mainly left sided, with facial sensitivity.  Associated photosensitivity and tinnitus to the left ear. Pt states that sx began as a regular headache, and worsened to her typical migraine.  She attempted to treat her sx unsuccessfully with ibuprofen and Excedrin migraine.  No vision changes, nausea or vomiting.  Sx have been constant since onset, currently rated a 7/10 in severity.  Pt denies experiencing any other acute sx, including fever or chills.  The history is provided by the patient and medical records. No language interpreter was used.    Past Medical History:  Diagnosis Date  . Depression   . Migraine     There are no active problems to display for this patient.   History reviewed. No pertinent surgical history.  OB History    Gravida Para Term Preterm AB Living   0 0 0 0 0 0   SAB TAB Ectopic Multiple Live Births   0 0 0 0         Home Medications    Prior to Admission medications   Medication Sig Start Date End Date Taking? Authorizing Provider  aspirin-acetaminophen-caffeine (EXCEDRIN MIGRAINE) [phone] MG tablet Take 1 tablet by mouth every 6 (six) hours as needed for headache.   Yes [provider]  etonogestrel (NEXPLANON) 68 MG IMPL implant Inject 1 each into the skin once. Reported on 11/03/2015   Yes [provider]    azithromycin (ZITHROMAX) 250 MG tablet Take 1 tablet (250 mg total) by mouth daily. Take first 2 tablets together, then 1 every day until finished. Patient not taking: Reported on 02/19/2017 01/31/17   Deatra Canter, FNP  benzonatate (TESSALON) 100 MG capsule Take 1 capsule (100 mg total) by mouth every 8 (eight) hours. Patient not taking: Reported on 02/19/2017 01/31/17   Deatra Canter, FNP  ipratropium (ATROVENT) 0.06 % nasal spray Place 2 sprays into both nostrils 4 (four) times daily. Patient not taking: Reported on 02/19/2017 01/31/17   Deatra Canter, FNP  varenicline (CHANTIX CONTINUING MONTH PAK) 1 MG tablet Take 1 tablet (1 mg total) by mouth 2 (two) times daily. Patient not taking: Reported on 02/19/2017 08/16/16   Shirline Frees, NP  varenicline (CHANTIX STARTING MONTH PAK) 0.5 MG X 11 & 1 MG X 42 tablet Take one 0.5 mg tablet by mouth once daily for 3 days, then increase to one 0.5 mg tablet twice daily for 4 days, then increase to one 1 mg tablet twice daily. Patient not taking: Reported on 02/19/2017 08/16/16   Shirline Frees, NP    Family History Family History  Problem Relation Age of Onset  . Diabetes Mother   . Hypertension Mother   . Depression Mother   . Anxiety disorder Mother   . Diabetes Father   . Hypertension Father   . Hyperlipidemia Father   . Personality disorder Father   .  Cancer Maternal Grandmother        Appendix?  . Cancer Maternal Grandfather        Skin ?    Social History Social History  Substance Use Topics  . Smoking status: Current Every Day Smoker    Packs/day: 0.25    Types: Cigarettes  . Smokeless tobacco: Never Used  . Alcohol use No     Allergies   Augmentin [amoxicillin-pot clavulanate]; Garlic powder [garlic]; Histamine; and Watermelon flavor   Review of Systems Review of Systems  Constitutional: Negative for chills and fever.  HENT: Positive for tinnitus.   Eyes: Positive for photophobia.  Cardiovascular: Negative for  chest pain.  Gastrointestinal: Negative for abdominal pain, nausea and vomiting.  Musculoskeletal: Negative for back pain, myalgias and neck pain.  Neurological: Positive for headaches. Negative for weakness, light-headedness and numbness.  All other systems reviewed and are negative.  Physical Exam Updated Vital Signs BP (!) 114/92 (BP Location: Left Arm)   Pulse 63   Temp 98.7 F (37.1 C) (Oral)   Resp 16   Ht 5\' 2"  (1.575 m)   Wt 89.4 kg (197 lb)   SpO2 100%   BMI 36.03 kg/m   Physical Exam  Constitutional: She is oriented to person, place, and time. She appears well-developed and well-nourished. No distress.  HENT:  Head: Normocephalic and atraumatic.  Right Ear: Hearing normal.  Left Ear: Hearing normal.  Nose: Nose normal.  Mouth/Throat: Oropharynx is clear and moist and mucous membranes are normal.  Left canal partially obstructed by cerumen, but TM clear.   Eyes: Conjunctivae and EOM are normal. Pupils are equal, round, and reactive to light.  Neck: Normal range of motion. Neck supple.  Cardiovascular: Regular rhythm, S1 normal and S2 normal.  Exam reveals no gallop and no friction rub.   No murmur heard. Pulmonary/Chest: Effort normal and breath sounds normal. No respiratory distress. She exhibits no tenderness.  Abdominal: Soft. Normal appearance and bowel sounds are normal. There is no hepatosplenomegaly. There is no tenderness. There is no rebound, no guarding, no tenderness at McBurney's point and negative Murphy's sign. No hernia.  Musculoskeletal: Normal range of motion.  Neurological: She is alert and oriented to person, place, and time. She has normal strength. No cranial nerve deficit or sensory deficit. Coordination normal. GCS eye subscore is 4. GCS verbal subscore is 5. GCS motor subscore is 6.  Skin: Skin is warm, dry and intact. No rash noted. No cyanosis.  Psychiatric: She has a normal mood and affect. Her speech is normal and behavior is normal. Thought  content normal.  Nursing note and vitals reviewed.  ED Treatments / Results  DIAGNOSTIC STUDIES: Oxygen Saturation is 100% on room air, normal by my interpretation.    COORDINATION OF CARE: 1:09 AM Discussed treatment plan with pt at bedside and pt agreed to plan.  Labs (all labs ordered are listed, but only abnormal results are displayed) Labs Reviewed - No data to display  EKG  EKG Interpretation None       Radiology No results found.  Procedures Procedures (including critical care time)  Medications Ordered in ED Medications  dexamethasone (DECADRON) injection 10 mg (not administered)  ketorolac (TORADOL) injection 60 mg (not administered)  prochlorperazine (COMPAZINE) injection 10 mg (not administered)  diphenhydrAMINE (BENADRYL) injection 25 mg (not administered)     Initial Impression / Assessment and Plan / ED Course  I have reviewed the triage vital signs and the nursing notes.  Pertinent labs &  imaging results that were available during my care of the patient were reviewed by me and considered in my medical decision making (see chart for details).     Patient presents to the emergency department for evaluation of migraine headache. Patient is currently experiencing a headache that is similar to previous migraines. Patient does not have any unusual features compared to previous migraines. Patient has normal neurologic examination. There are no unusual features, such as unusual intensity or sudden onset. As this headache is similar to previous migraines, there is no concern for subarachnoid hemorrhage or other etiology. Patient therefore does not require imaging. Patient treated as migraine headache.  I personally performed the services described in this documentation, which was scribed in my presence. The recorded information has been reviewed and is accurate.  Final Clinical Impressions(s) / ED Diagnoses   Final diagnoses:  Migraine without aura and without  status migrainosus, not intractable    New Prescriptions New Prescriptions   No medications on file     Gilda Crease, MD 02/19/17 0200

## 2017-05-17 ENCOUNTER — Ambulatory Visit: Payer: BLUE CROSS/BLUE SHIELD | Admitting: Internal Medicine

## 2017-05-17 ENCOUNTER — Encounter: Payer: Self-pay | Admitting: Adult Health

## 2017-05-17 DIAGNOSIS — Z0289 Encounter for other administrative examinations: Secondary | ICD-10-CM

## 2017-05-17 NOTE — Progress Notes (Deleted)
No chief complaint on file.   HPI: Jacqueline Erickson 26 y.o.  sda  PCP NA  ghas been seen x 1 CN  Seen in ed x 3 for uri migrinae anc cold   Tobacco  Asthma  ROS: See pertinent positives and negatives per HPI.  Past Medical History:  Diagnosis Date  . Depression   . Migraine     Family History  Problem Relation Age of Onset  . Diabetes Mother   . Hypertension Mother   . Depression Mother   . Anxiety disorder Mother   . Diabetes Father   . Hypertension Father   . Hyperlipidemia Father   . Personality disorder Father   . Cancer Maternal Grandmother        Appendix?  . Cancer Maternal Grandfather        Skin ?    Social History   Social History  . Marital status: Single    Spouse name: N/A  . Number of children: N/A  . Years of education: N/A   Social History Main Topics  . Smoking status: Current Every Day Smoker    Packs/day: 0.25    Types: Cigarettes  . Smokeless tobacco: Never Used  . Alcohol use No  . Drug use: No  . Sexual activity: Yes    Birth control/ protection: Implant   Other Topics Concern  . Not on file   Social History Narrative  . No narrative on file    Outpatient Medications Prior to Visit  Medication Sig Dispense Refill  . aspirin-acetaminophen-caffeine (EXCEDRIN MIGRAINE) 250-250-65 MG tablet Take 1 tablet by mouth every 6 (six) hours as needed for headache.    Marland Kitchen azithromycin (ZITHROMAX) 250 MG tablet Take 1 tablet (250 mg total) by mouth daily. Take first 2 tablets together, then 1 every day until finished. (Patient not taking: Reported on 02/19/2017) 6 tablet 0  . benzonatate (TESSALON) 100 MG capsule Take 1 capsule (100 mg total) by mouth every 8 (eight) hours. (Patient not taking: Reported on 02/19/2017) 21 capsule 0  . etonogestrel (NEXPLANON) 68 MG IMPL implant Inject 1 each into the skin once. Reported on 11/03/2015    . ipratropium (ATROVENT) 0.06 % nasal spray Place 2 sprays into both nostrils 4 (four) times daily. (Patient  not taking: Reported on 02/19/2017) 15 mL 0  . SUMAtriptan (IMITREX) 100 MG tablet Take 1 tablet at the onset of migraine symptoms. May repeat one time in 2 hours if headache persists or recurs. 20 tablet 0  . varenicline (CHANTIX CONTINUING MONTH PAK) 1 MG tablet Take 1 tablet (1 mg total) by mouth 2 (two) times daily. (Patient not taking: Reported on 02/19/2017) 60 tablet 2  . varenicline (CHANTIX STARTING MONTH PAK) 0.5 MG X 11 & 1 MG X 42 tablet Take one 0.5 mg tablet by mouth once daily for 3 days, then increase to one 0.5 mg tablet twice daily for 4 days, then increase to one 1 mg tablet twice daily. (Patient not taking: Reported on 02/19/2017) 53 tablet 0   No facility-administered medications prior to visit.      EXAM:  There were no vitals taken for this visit.  There is no height or weight on file to calculate BMI. WDWN in NAD  quiet respirations; mildly congested  somewhat hoarse. Non toxic . HEENT: Normocephalic ;atraumatic , Eyes;  PERRL, EOMs  Full, lids and conjunctiva clear,,Ears: no deformities, canals nl, TM landmarks normal, Nose: no deformity or discharge but congested;face minimally tender Mouth :  OP clear without lesion or edema . Neck: Supple without adenopathy or masses or bruits Chest:  Clear to A&P without wheezes rales or rhonchi CV:  S1-S2 no gallops or murmurs peripheral perfusion is normal Skin :nl perfusion and no acute rashes   Wt Readings from Last 3 Encounters:  02/18/17 197 lb (89.4 kg)  08/16/16 197 lb 6.4 oz (89.5 kg)  05/21/16 190 lb (86.2 kg)    ASSESSMENT AND PLAN:  Discussed the following assessment and plan:  No diagnosis found.  -Patient advised to return or notify health care team  if symptoms worsen ,persist or new concerns arise.  There are no Patient Instructions on file for this visit.   Neta Mends. Sandeep Delagarza M.D.

## 2017-07-24 ENCOUNTER — Encounter: Payer: Self-pay | Admitting: Obstetrics & Gynecology

## 2017-07-24 ENCOUNTER — Ambulatory Visit (INDEPENDENT_AMBULATORY_CARE_PROVIDER_SITE_OTHER): Payer: Managed Care, Other (non HMO) | Admitting: Obstetrics & Gynecology

## 2017-07-24 VITALS — BP 126/86 | HR 85 | Ht 62.0 in | Wt 227.0 lb

## 2017-07-24 DIAGNOSIS — Z01419 Encounter for gynecological examination (general) (routine) without abnormal findings: Secondary | ICD-10-CM | POA: Diagnosis not present

## 2017-07-24 DIAGNOSIS — E669 Obesity, unspecified: Secondary | ICD-10-CM

## 2017-07-24 DIAGNOSIS — R5383 Other fatigue: Secondary | ICD-10-CM

## 2017-07-24 DIAGNOSIS — Z113 Encounter for screening for infections with a predominantly sexual mode of transmission: Secondary | ICD-10-CM | POA: Diagnosis not present

## 2017-07-24 NOTE — Progress Notes (Signed)
Subjective:     Jacqueline Erickson is a 26 y.o. female here for a routine exam.  Current complaints: pt has a boil in hte vulva that has been present for 1 year. It has been intermittently irritating.  Pt reports freq yeast vaginitis.    Gynecologic History Patient's last menstrual period was 07/21/2017. Contraception: Nexplanon Last Pap: 02/01/2016. Results were: normal Last mammogram: n/a.   Obstetric History OB History  Gravida Para Term Preterm AB Living  0 0 0 0 0 0  SAB TAB Ectopic Multiple Live Births  0 0 0 0         The following portions of the patient's history were reviewed and updated as appropriate: allergies, current medications, past family history, past medical history, past social history, past surgical history and problem list.  Review of Systems Pertinent items are noted in HPI.    Objective:  BP 126/86   Pulse 85   Ht 5\' 2"  (1.575 m)   Wt 227 lb (103 kg)   LMP 07/21/2017   BMI 41.52 kg/m  General Appearance:    Alert, cooperative, no distress, appears stated age  Head:    Normocephalic, without obvious abnormality, atraumatic  Eyes:    conjunctiva/corneas clear, EOM's intact, both eyes  Ears:    Normal external ear canals, both ears  Nose:   Nares normal, septum midline, mucosa normal, no drainage    or sinus tenderness  Throat:   Lips, mucosa, and tongue normal; teeth and gums normal  Neck:   Supple, symmetrical, trachea midline, no adenopathy;    thyroid:  no enlargement/tenderness/nodules  Back:     Symmetric, no curvature, ROM normal, no CVA tenderness  Lungs:     Clear to auscultation bilaterally, respirations unlabored  Chest Wall:    No tenderness or deformity   Heart:    Regular rate and rhythm, S1 and S2 normal, no murmur, rub   or gallop  Breast Exam:    No tenderness, masses, or nipple abnormality  Abdomen:     Soft, non-tender, bowel sounds active all four quadrants,    no masses, no organomegaly  Genitalia:    Normal female. No discharge  or tenderness; vulvar abscess c/w folliculitis     Extremities:   Extremities normal, atraumatic, no cyanosis or edema  Pulses:   2+ and symmetric all extremities  Skin:   Skin color, texture, turgor normal, no rashes or lesions       Assessment:    Healthy female exam.   Fatigue STI screen   Plan:    Follow up in: 1 year.    STI screen: HIV, RPR, Hep B & C CBC and TSH  Jacqueline Erickson, M.D., [place] [place]

## 2017-07-25 LAB — CBC
HEMOGLOBIN: 13.6 g/dL (ref 11.1–15.9)
Hematocrit: 40 % (ref 34.0–46.6)
MCH: 30.5 pg (ref 26.6–33.0)
MCHC: 34 g/dL (ref 31.5–35.7)
MCV: 90 fL (ref 79–97)
PLATELETS: 281 10*3/uL (ref 150–379)
RBC: 4.46 x10E6/uL (ref 3.77–5.28)
RDW: 13.9 % (ref 12.3–15.4)
WBC: 10.4 10*3/uL (ref 3.4–10.8)

## 2017-07-25 LAB — CERVICOVAGINAL ANCILLARY ONLY
Chlamydia: NEGATIVE
Neisseria Gonorrhea: NEGATIVE
Trichomonas: NEGATIVE

## 2017-07-25 LAB — HEMOGLOBIN A1C
Est. average glucose Bld gHb Est-mCnc: 111 mg/dL
HEMOGLOBIN A1C: 5.5 % (ref 4.8–5.6)

## 2017-07-25 LAB — RPR: RPR Ser Ql: NONREACTIVE

## 2017-07-25 LAB — HEPATITIS C ANTIBODY

## 2017-07-25 LAB — HEPATITIS B SURFACE ANTIGEN: HEP B S AG: NEGATIVE

## 2017-07-25 LAB — TSH: TSH: 1.37 u[IU]/mL (ref 0.450–4.500)

## 2017-07-25 LAB — HIV ANTIBODY (ROUTINE TESTING W REFLEX): HIV SCREEN 4TH GENERATION: NONREACTIVE

## 2017-12-12 ENCOUNTER — Ambulatory Visit: Payer: Managed Care, Other (non HMO) | Admitting: Adult Health

## 2017-12-12 DIAGNOSIS — Z0289 Encounter for other administrative examinations: Secondary | ICD-10-CM

## 2017-12-17 ENCOUNTER — Ambulatory Visit: Payer: Managed Care, Other (non HMO) | Admitting: Adult Health

## 2017-12-17 DIAGNOSIS — Z2089 Contact with and (suspected) exposure to other communicable diseases: Secondary | ICD-10-CM

## 2018-04-10 ENCOUNTER — Telehealth: Payer: Self-pay

## 2018-04-10 NOTE — Telephone Encounter (Signed)
Patient states she has been on her period for last twelve days. Patient states that blood is running down her leg. Patient also states she is having nickel size clots.   Patient complaining of fatigue. Patient has scheduled appointment for august 26th. Encouraged patient to seek treatment today at [facility] because of the amount of bleeding she is having. Patient states understanding and states she will go for evaluation. Armandina Stammer RN

## 2018-04-21 ENCOUNTER — Ambulatory Visit: Payer: Managed Care, Other (non HMO) | Admitting: Obstetrics & Gynecology

## 2018-04-21 DIAGNOSIS — N939 Abnormal uterine and vaginal bleeding, unspecified: Secondary | ICD-10-CM

## 2018-05-19 ENCOUNTER — Encounter (HOSPITAL_COMMUNITY): Payer: Self-pay | Admitting: *Deleted

## 2018-05-19 ENCOUNTER — Inpatient Hospital Stay (HOSPITAL_COMMUNITY)
Admission: AD | Admit: 2018-05-19 | Discharge: 2018-05-19 | Disposition: A | Discharge status: Home / Self Care | Payer: BLUE CROSS/BLUE SHIELD | Source: Ambulatory Visit | Attending: Obstetrics and Gynecology | Admitting: Obstetrics and Gynecology

## 2018-05-19 ENCOUNTER — Other Ambulatory Visit: Payer: Self-pay

## 2018-05-19 DIAGNOSIS — Z7982 Long term (current) use of aspirin: Secondary | ICD-10-CM | POA: Insufficient documentation

## 2018-05-19 DIAGNOSIS — B9689 Other specified bacterial agents as the cause of diseases classified elsewhere: Secondary | ICD-10-CM

## 2018-05-19 DIAGNOSIS — N760 Acute vaginitis: Secondary | ICD-10-CM | POA: Diagnosis not present

## 2018-05-19 DIAGNOSIS — N926 Irregular menstruation, unspecified: Secondary | ICD-10-CM | POA: Insufficient documentation

## 2018-05-19 DIAGNOSIS — R109 Unspecified abdominal pain: Secondary | ICD-10-CM | POA: Diagnosis present

## 2018-05-19 DIAGNOSIS — Z880 Allergy status to penicillin: Secondary | ICD-10-CM | POA: Diagnosis not present

## 2018-05-19 LAB — URINALYSIS, ROUTINE W REFLEX MICROSCOPIC
Bilirubin Urine: NEGATIVE
GLUCOSE, UA: NEGATIVE mg/dL
Hgb urine dipstick: NEGATIVE
Ketones, ur: NEGATIVE mg/dL
Nitrite: NEGATIVE
PH: 7 (ref 5.0–8.0)
Protein, ur: NEGATIVE mg/dL
SPECIFIC GRAVITY, URINE: 1.014 (ref 1.005–1.030)

## 2018-05-19 LAB — WET PREP, GENITAL
SPERM: NONE SEEN
TRICH WET PREP: NONE SEEN
Yeast Wet Prep HPF POC: NONE SEEN

## 2018-05-19 LAB — POCT PREGNANCY, URINE: Preg Test, Ur: NEGATIVE

## 2018-05-19 MED ORDER — METRONIDAZOLE 500 MG PO TABS: 500.0000 mg | ORAL_TABLET | Freq: Two times a day (BID) | ORAL | 0 refills | Status: DC

## 2018-05-19 NOTE — MAU Note (Signed)
Had regular period the beginning of Aug.  Period ended and a wk later started again.  For 3 wks was having heavy bleeding, passing clots, stopped 8/29.  Had a regular period the first wk in Sept.  Still feeling cramping, only on left side.tried to get into primary, was told to come here.

## 2018-05-19 NOTE — Discharge Instructions (Signed)
Bacterial Vaginosis Bacterial vaginosis is an infection of the vagina. It happens when too many germs (bacteria) grow in the vagina. This infection puts you at risk for infections from sex (STIs). Treating this infection can lower your risk for some STIs. You should also treat this if you are pregnant. It can cause your baby to be born early. Follow these instructions at home: Medicines  Take over-the-counter and prescription medicines only as told by your doctor.  Take or use your antibiotic medicine as told by your doctor. Do not stop taking or using it even if you start to feel better. General instructions  If you your sexual partner is a woman, tell her that you have this infection. She needs to get treatment if she has symptoms. If you have a female partner, he does not need to be treated.  During treatment: ? Avoid sex. ? Do not douche. ? Avoid alcohol as told. ? Avoid breastfeeding as told.  Drink enough fluid to keep your pee (urine) clear or pale yellow.  Keep your vagina and butt (rectum) clean. ? Wash the area with warm water every day. ? Wipe from front to back after you use the toilet.  Keep all follow-up visits as told by your doctor. This is important. Preventing this condition  Do not douche.  Use only warm water to wash around your vagina.  Use protection when you have sex. This includes: ? Latex condoms. ? Dental dams.  Limit how many people you have sex with. It is best to only have sex with the same person (be monogamous).  Get tested for STIs. Have your partner get tested.  Wear underwear that is cotton or lined with cotton.  Avoid tight pants and pantyhose. This is most important in summer.  Do not use any products that have nicotine or tobacco in them. These include cigarettes and e-cigarettes. If you need help quitting, ask your doctor.  Do not use illegal drugs.  Limit how much alcohol you drink. Contact a doctor if:  Your symptoms do not get  better, even after you are treated.  You have more discharge or pain when you pee (urinate).  You have a fever.  You have pain in your belly (abdomen).  You have pain with sex.  Your bleed from your vagina between periods. Summary  This infection happens when too many germs (bacteria) grow in the vagina.  Treating this condition can lower your risk for some infections from sex (STIs).  You should also treat this if you are pregnant. It can cause early (premature) birth.  Do not stop taking or using your antibiotic medicine even if you start to feel better. This information is not intended to replace advice given to you by your health care provider. Make sure you discuss any questions you have with your health care provider. Document Released: 05/22/2008 Document Revised: 04/28/2016 Document Reviewed: 04/28/2016 Elsevier Interactive Patient Education  2017 Else[employer] Inc[employer]   Abnormal Uterine Bleeding Abnormal uterine bleeding means bleeding more than usual from your uterus. It can include:  Bleeding between periods.  Bleeding after sex.  Bleeding that is heavier than normal.  Periods that last longer than usual.  Bleeding after you have stopped having your period (menopause).  There are many problems that may cause this. You should see a doctor for any kind of bleeding that is not normal. Treatment depends on the cause of the bleeding. Follow these instructions at home:  Watch your condition for any changes.  Do not use tampons, douche, or have sex, if your doctor tells you not to.  Change your pads often.  Get regular well-woman exams. Make sure they include a pelvic exam and cervical cancer screening.  Keep all follow-up visits as told by your doctor. This is important. Contact a doctor if:  The bleeding lasts more than one week.  You feel dizzy at times.  You feel like you are going to throw up (nauseous).  You throw up. Get help right away if:  You pass  out.  You have to change pads every hour.  You have belly (abdominal) pain.  You have a fever.  You get sweaty.  You get weak.  You passing large blood clots from your vagina. Summary  Abnormal uterine bleeding means bleeding more than usual from your uterus.  There are many problems that may cause this. You should see a doctor for any kind of bleeding that is not normal.  Treatment depends on the cause of the bleeding. This information is not intended to replace advice given to you by your health care provider. Make sure you discuss any questions you have with your health care provider. Document Released: 06/10/2009 Document Revised: 08/07/2016 Document Reviewed: 08/07/2016 Elsevier Interactive Patient Education  2017 [employer].

## 2018-05-19 NOTE — MAU Provider Note (Signed)
History     CSN: [id number]  Arrival date and time: 05/19/18 1404   First Provider Initiated Contact with Patient 05/19/18 1601      Chief Complaint  Patient presents with  . Abdominal Pain   HPI   Ms..Jacqueline Erickson is a 27 y.o. female G0P0000 here in MAU with abnormal menstrual cycles. She had a normal menstrual cycle in September. In August she had 2 menstrual cycles and the second cycle she had in August was very heavy with clots. She is not bleeding currently. However she feels like her period should come on with the abdominal cramps that she is having. She has lower abdominal cramping occasionally during the day. The cramping comes in waves. When the pain comes she rates it 7/10. She has tried taking ibuprofen which helps some. She has nexplanon in her arm, her 2 years. She has not had abnormal cycles with this. She denies pain at this time.   OB History    Gravida  0   Para  0   Term  0   Preterm  0   AB  0   Living  0     SAB  0   TAB  0   Ectopic  0   Multiple  0   Live Births              Past Medical History:  Diagnosis Date  . Depression   . Migraine     Past Surgical History:  Procedure Laterality Date  . NO PAST SURGERIES      Family History  Problem Relation Age of Onset  . Diabetes Mother   . Hypertension Mother   . Depression Mother   . Anxiety disorder Mother   . Diabetes Father   . Hypertension Father   . Hyperlipidemia Father   . Personality disorder Father   . Cancer Maternal Grandmother        Appendix?  . Cancer Maternal Grandfather        Skin ?    Social History   Tobacco Use  . Smoking status: Current Every Day Smoker    Packs/day: 0.25    Types: Cigarettes  . Smokeless tobacco: Never Used  Substance Use Topics  . Alcohol use: Yes    Comment: Socially  . Drug use: Yes    Types: Marijuana    Comment: daily    Allergies:  Allergies  Allergen Reactions  . Augmentin [Amoxicillin-Pot Clavulanate]    hives  . Garlic Powder [Garlic] Hives  . Histamine Hives  . Watermelon Flavor Hives    "watermelon fruit"    Medications Prior to Admission  Medication Sig Dispense Refill Last Dose  . aspirin-acetaminophen-caffeine (EXCEDRIN MIGRAINE) 250-250-65 MG tablet Take 1 tablet by mouth every 6 (six) hours as needed for headache.   Taking  . etonogestrel (NEXPLANON) 68 MG IMPL implant Inject 1 each into the skin once. Reported on 11/03/2015   Taking  . SUMAtriptan (IMITREX) 100 MG tablet Take 1 tablet at the onset of migraine symptoms. May repeat one time in 2 hours if headache persists or recurs. 20 tablet 0 Taking   Results for orders placed or performed during the hospital encounter of 05/19/18 (from the past 48 hour(s))  Urinalysis, Routine w reflex microscopic     Status: Abnormal   Collection Time: 05/19/18  2:27 PM  Result Value Ref Range   Color, Urine YELLOW YELLOW   APPearance HAZY (A) CLEAR   Specific Gravity, Urine  1.014 1.005 - 1.030   pH 7.0 5.0 - 8.0   Glucose, UA NEGATIVE NEGATIVE mg/dL   Hgb urine dipstick NEGATIVE NEGATIVE   Bilirubin Urine NEGATIVE NEGATIVE   Ketones, ur NEGATIVE NEGATIVE mg/dL   Protein, ur NEGATIVE NEGATIVE mg/dL   Nitrite NEGATIVE NEGATIVE   Leukocytes, UA MODERATE (A) NEGATIVE   RBC / HPF 0-5 0 - 5 RBC/hpf   WBC, UA 21-50 0 - 5 WBC/hpf   Bacteria, UA RARE (A) NONE SEEN   Squamous Epithelial / LPF 21-50 0 - 5   Mucus PRESENT     Comment: Performed at [facility], [address], [place], [place] [address]  Pregnancy, urine POC     Status: None   Collection Time: 05/19/18  2:30 PM  Result Value Ref Range   Preg Test, Ur NEGATIVE NEGATIVE    Comment:        THE SENSITIVITY OF THIS METHODOLOGY IS >24 mIU/mL   Wet prep, genital     Status: Abnormal   Collection Time: 05/19/18  4:10 PM  Result Value Ref Range   Yeast Wet Prep HPF POC NONE SEEN NONE SEEN   Trich, Wet Prep NONE SEEN NONE SEEN   Clue Cells Wet Prep HPF POC PRESENT (A) NONE SEEN    WBC, Wet Prep HPF POC MANY (A) NONE SEEN   Sperm NONE SEEN     Comment: Performed at [facility], [address] [place], [place] [address]    Review of Systems  Constitutional: Negative for fever.  Gastrointestinal: Positive for abdominal pain.  Genitourinary: Negative for dysuria, frequency, urgency, vaginal bleeding and vaginal discharge.   Physical Exam   Blood pressure 124/69, pulse 82, temperature 98.9 F (37.2 C), temperature source Oral, resp. rate 18, weight 97.4 kg, last menstrual period 04/29/2018, SpO2 100 %.  Physical Exam  Constitutional: She is oriented to person, place, and time. She appears well-developed and well-nourished. No distress.  HENT:  Head: Normocephalic.  Eyes: Pupils are equal, round, and reactive to light.  Neck: Neck supple.  GI: Soft. She exhibits no distension. There is no tenderness. There is no rebound and no guarding.  Genitourinary:  Genitourinary Comments: Bimanual exam: Cervix closed, posterior. Mild odor.  Uterus non tender, normal size Adnexa non tender, no masses bilaterally GC/Chlam, wet prep done collected without speculum  Chaperone present for exam.   Musculoskeletal: Normal range of motion.  Neurological: She is alert and oriented to person, place, and time.  Skin: Skin is warm. She is not diaphoretic.  Psychiatric: Her behavior is normal.    MAU Course  Procedures  None  MDM  Wet prep and GC UA  Assessment and Plan   A:  1. Abnormal menstrual cycle   2. BV (bacterial vaginosis)     P:  Discharge home in stable condition Out patient pelvic US ordered Message sent to the office to schedule a f/u appointment. Patient would like to see Dr. Luretha Murphy, however is unable to travel to the [facility] office.  Return to MAU if symptoms worsen Rx: Flagyl Condoms always  Rasch, Harolyn Rutherford, NP 05/19/2018 7:49 PM

## 2018-05-20 ENCOUNTER — Telehealth: Payer: Self-pay | Admitting: Student

## 2018-05-20 LAB — GC/CHLAMYDIA PROBE AMP (~~LOC~~) NOT AT ARMC
Chlamydia: POSITIVE — AB
Neisseria Gonorrhea: POSITIVE — AB

## 2018-05-20 NOTE — Telephone Encounter (Signed)
Called and left patient a VM in regards to her f/u US appt on 06/20/18 @ 9:35. Also sent patient a mychart message with this appt.

## 2018-05-23 ENCOUNTER — Ambulatory Visit (INDEPENDENT_AMBULATORY_CARE_PROVIDER_SITE_OTHER): Payer: BLUE CROSS/BLUE SHIELD | Admitting: *Deleted

## 2018-05-23 VITALS — BP 129/69 | HR 96 | Ht 62.0 in | Wt 214.0 lb

## 2018-05-23 DIAGNOSIS — Z202 Contact with and (suspected) exposure to infections with a predominantly sexual mode of transmission: Secondary | ICD-10-CM | POA: Diagnosis not present

## 2018-05-23 MED ORDER — AZITHROMYCIN 250 MG PO TABS
1000.0000 mg | ORAL_TABLET | Freq: Once | ORAL | Status: AC
  Administered 2018-05-23: 1000 mg via ORAL

## 2018-05-23 MED ORDER — CEFTRIAXONE SODIUM 250 MG IJ SOLR
250.0000 mg | Freq: Once | INTRAMUSCULAR | Status: AC
Start: 2018-05-23 — End: 2018-05-23
  Administered 2018-05-23: 250 mg via INTRAMUSCULAR

## 2018-05-23 NOTE — Progress Notes (Signed)
Chart reviewed for nurse visit. Agree with plan of care.   Rolm Bookbinder, [place] 05/23/2018 4:31 PM

## 2018-05-23 NOTE — Progress Notes (Signed)
Pt received Tx for +GC and +CT as scheduled. Pt advised no sex for 2 weeks and no sex with partner until 2 weeks after his treatment.  Pt voiced understanding.

## 2018-06-04 ENCOUNTER — Other Ambulatory Visit: Payer: Self-pay | Admitting: General Practice

## 2018-06-04 DIAGNOSIS — B379 Candidiasis, unspecified: Secondary | ICD-10-CM

## 2018-06-04 DIAGNOSIS — T3695XA Adverse effect of unspecified systemic antibiotic, initial encounter: Principal | ICD-10-CM

## 2018-06-04 MED ORDER — FLUCONAZOLE 150 MG PO TABS: 150.0000 mg | ORAL_TABLET | Freq: Once | ORAL | 0 refills | Status: AC

## 2018-06-11 ENCOUNTER — Ambulatory Visit (HOSPITAL_COMMUNITY): Payer: BLUE CROSS/BLUE SHIELD | Attending: Obstetrics and Gynecology

## 2018-06-20 ENCOUNTER — Ambulatory Visit: Payer: BLUE CROSS/BLUE SHIELD | Admitting: Obstetrics & Gynecology

## 2018-07-30 ENCOUNTER — Ambulatory Visit (HOSPITAL_COMMUNITY)
Admission: EM | Admit: 2018-07-30 | Discharge: 2018-07-30 | Disposition: A | Discharge status: Home / Self Care | Payer: BLUE CROSS/BLUE SHIELD | Attending: Family Medicine | Admitting: Family Medicine

## 2018-07-30 ENCOUNTER — Encounter (HOSPITAL_COMMUNITY): Payer: Self-pay

## 2018-07-30 DIAGNOSIS — J069 Acute upper respiratory infection, unspecified: Secondary | ICD-10-CM

## 2018-07-30 DIAGNOSIS — B9789 Other viral agents as the cause of diseases classified elsewhere: Secondary | ICD-10-CM | POA: Diagnosis not present

## 2018-07-30 DIAGNOSIS — J019 Acute sinusitis, unspecified: Secondary | ICD-10-CM | POA: Diagnosis not present

## 2018-07-30 MED ORDER — MUPIROCIN CALCIUM 2 % NA OINT: TOPICAL_OINTMENT | NASAL | 0 refills | Status: DC

## 2018-07-30 MED ORDER — AZITHROMYCIN 250 MG PO TABS: 250.0000 mg | ORAL_TABLET | Freq: Every day | ORAL | 0 refills | Status: AC

## 2018-07-30 MED ORDER — HYDROCODONE-HOMATROPINE 5-1.5 MG/5ML PO SYRP: 5.0000 mL | ORAL_SOLUTION | Freq: Four times a day (QID) | ORAL | 0 refills | Status: DC | PRN

## 2018-07-30 NOTE — ED Provider Notes (Signed)
[facility]    CSN: [id number] Arrival date & time: 07/30/18  1958     History   Chief Complaint Chief Complaint  Patient presents with  . URI    HPI Jacqueline Erickson is a 27 y.o. female.   Patient has yellow-green nasal drainage as well as postnasal drainage that triggers a cough.  She endorses pain and pressure above and below her eyes and headache.  Started out as cold but has progressed to the symptoms.  HPI  Past Medical History:  Diagnosis Date  . Depression   . Migraine     There are no active problems to display for this patient.   Past Surgical History:  Procedure Laterality Date  . NO PAST SURGERIES      OB History    Gravida  0   Para  0   Term  0   Preterm  0   AB  0   Living  0     SAB  0   TAB  0   Ectopic  0   Multiple  0   Live Births               Home Medications    Prior to Admission medications   Medication Sig Start Date End Date Taking? Authorizing Provider  aspirin-acetaminophen-caffeine (EXCEDRIN MIGRAINE) [phone] MG tablet Take 1 tablet by mouth every 6 (six) hours as needed for headache.    [provider]  etonogestrel (NEXPLANON) 68 MG IMPL implant Inject 1 each into the skin once. Reported on 11/03/2015    [provider]  metroNIDAZOLE (FLAGYL) 500 MG tablet Take 1 tablet (500 mg total) by mouth 2 (two) times daily. 05/19/18   Rasch, Victorino Dike I, NP  SUMAtriptan (IMITREX) 100 MG tablet Take 1 tablet at the onset of migraine symptoms. May repeat one time in 2 hours if headache persists or recurs. 02/19/17   Gilda Crease, MD    Family History Family History  Problem Relation Age of Onset  . Diabetes Mother   . Hypertension Mother   . Depression Mother   . Anxiety disorder Mother   . Diabetes Father   . Hypertension Father   . Hyperlipidemia Father   . Personality disorder Father   . Cancer Maternal Grandmother        Appendix?  . Cancer Maternal Grandfather     Skin ?    Social History Social History   Tobacco Use  . Smoking status: Current Every Day Smoker    Packs/day: 0.25    Types: Cigarettes  . Smokeless tobacco: Never Used  Substance Use Topics  . Alcohol use: Yes    Comment: Socially  . Drug use: Yes    Types: Marijuana    Comment: daily     Allergies   Augmentin [amoxicillin-pot clavulanate]; Garlic powder [garlic]; Histamine; and Watermelon flavor   Review of Systems Review of Systems  Constitutional: Negative.   HENT: Positive for congestion, nosebleeds, rhinorrhea, sinus pressure and sinus pain.   Respiratory: Positive for cough.   All other systems reviewed and are negative.    Physical Exam Triage Vital Signs ED Triage Vitals  Enc Vitals Group     BP --      Pulse Rate 07/30/18 2023 99     Resp 07/30/18 2023 20     Temp 07/30/18 2023 98.7 F (37.1 C)     Temp Source 07/30/18 2023 Oral     SpO2 07/30/18 2023  97 %     Weight --      Height --      Head Circumference --      Peak Flow --      Pain Score 07/30/18 2022 5     Pain Loc --      Pain Edu? --      Excl. in GC? --    No data found.  Updated Vital Signs Pulse 99   Temp 98.7 F (37.1 C) (Oral)   Resp 20   SpO2 97%   Visual Acuity Right Eye Distance:   Left Eye Distance:   Bilateral Distance:    Right Eye Near:   Left Eye Near:    Bilateral Near:     Physical Exam  Constitutional: She appears well-developed and well-nourished.  HENT:  Head: Normocephalic.  Mouth/Throat: Oropharynx is clear and moist.  There is postnasal drainage present  Cardiovascular: Normal rate, regular rhythm and normal heart sounds.  Pulmonary/Chest: Breath sounds normal.  Neurological: She is alert.     UC Treatments / Results  Labs (all labs ordered are listed, but only abnormal results are displayed) Labs Reviewed - No data to display  EKG None  Radiology No results found.  Procedures Procedures (including critical care  time)  Medications Ordered in UC Medications - No data to display  Initial Impression / Assessment and Plan / UC Course  I have reviewed the triage vital signs and the nursing notes.  Pertinent labs & imaging results that were available during my care of the patient were reviewed by me and considered in my medical decision making (see chart for details).     Sinusitis.  Will treat with Mucinex Hycodan for nighttime cough suppression plenty of fluids. Final Clinical Impressions(s) / UC Diagnoses   Final diagnoses:  None   Discharge Instructions   None    ED Prescriptions    None     Controlled Substance Prescriptions [place] Controlled Substance Registry consulted? No   Frederica Kuster, MD 07/30/18 2053

## 2018-07-30 NOTE — ED Triage Notes (Signed)
Pt presents with upper respiratory symptoms; nasal drainage, congestion, and cough with green & yellow mucus.

## 2018-11-11 ENCOUNTER — Ambulatory Visit: Payer: BLUE CROSS/BLUE SHIELD | Admitting: Adult Health

## 2018-11-11 ENCOUNTER — Ambulatory Visit (INDEPENDENT_AMBULATORY_CARE_PROVIDER_SITE_OTHER): Payer: BLUE CROSS/BLUE SHIELD | Admitting: Adult Health

## 2018-11-11 ENCOUNTER — Encounter: Payer: Self-pay | Admitting: Adult Health

## 2018-11-11 ENCOUNTER — Other Ambulatory Visit: Payer: Self-pay

## 2018-11-11 ENCOUNTER — Telehealth: Payer: Self-pay | Admitting: Adult Health

## 2018-11-11 VITALS — BP 126/78 | Temp 98.7°F

## 2018-11-11 DIAGNOSIS — J069 Acute upper respiratory infection, unspecified: Secondary | ICD-10-CM | POA: Diagnosis not present

## 2018-11-11 MED ORDER — DOXYCYCLINE HYCLATE 100 MG PO CAPS: 100.0000 mg | ORAL_CAPSULE | Freq: Two times a day (BID) | ORAL | 0 refills | Status: DC

## 2018-11-11 MED ORDER — PREDNISONE 10 MG PO TABS: ORAL_TABLET | ORAL | 0 refills | Status: DC

## 2018-11-11 NOTE — Telephone Encounter (Signed)
Questions for Screening COVID-19  Symptom onset:  +Scratchy throat/sore x 1-2 days.  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 Fever >100.61F []   Yes [x]   No []   Unknown Subjective fever (felt feverish) []   Yes [x]   No []   Unknown Chills []   Yes [x]   No []   Unknown Muscle aches (myalgia) []   Yes [x]   No []   Unknown Runny nose (rhinorrhea) [x]   Yes []   No []   Unknown Sore throat [x]   Yes []   No []   Unknown Cough (new onset or worsening of chronic cough) [x]   Yes []   No []   Unknown Shortness of breath (dyspnea) [x]   Yes []   No []   Unknown Nausea or vomiting []   Yes [x]   No []   Unknown Headache [x]   Yes []   No []   Unknown Abdominal pain  []   Yes [x]   No []   Unknown Diarrhea (?3 loose/looser than normal stools/24hr period) []   Yes [x]   No []   Unknown Other, specify:_____________________________________________   Patient risk factors: Smoker? []   Current []   Former []   Never If female, currently pregnant? []   Yes []   No  There are no active problems to display for this patient.   Plan:  []   High risk for COVID-19 with red flags go to ED (with CP, SOB, weak/lightheaded, or fever > 101.5). Call ahead.  [x]   High risk for COVID-19 but stable will have car visit. Inform provider and coordinate time. Will be completed in afternoon. []   No red flags but URI signs or symptoms will go through side door and be seen in dedicated room.  Note: Referral to telemedicine is an appropriate alternative disposition for higher risk but stable. [facility]/e-Visit: [phone].

## 2018-11-11 NOTE — Progress Notes (Signed)
Subjective:    Patient ID: Jacqueline Erickson, female    DOB: 1990-10-01, 28 y.o.   MRN: [id number]  HPI  28 year old female who  has a past medical history of Depression and Migraine.  Presents to the office today for an acute issue.  Symptoms include scratchy throat that started yesterday, cough x1 day with green mucus, head congestion, sinus pain and pressure worse over the right eye headache and increasing shortness of breath.  Her symptoms started approximately 1 to 2 days ago.    He denies fevers but did have chills over the weekend.  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  She is currently going to be working at home for the next 6 weeks.  Denies any nausea, vomiting, or diarrhea  He is currently a smoker  He is having to use her inhaler more often, and had to use her nebulizer at night which helped with her shortness of breath. Not currently feeling short of breath in the office   Review of Systems See HPI   Past Medical History:  Diagnosis Date  . Depression   . Migraine     Social History   Socioeconomic History  . Marital status: Single    Spouse name: Not on file  . Number of children: Not on file  . Years of education: Not on file  . Highest education level: Not on file  Occupational History  . Not on file  Social Needs  . Financial resource strain: Not on file  . Food insecurity:    Worry: Not on file    Inability: Not on file  . Transportation needs:    Medical: Not on file    Non-medical: Not on file  Tobacco Use  . Smoking status: Current Every Day Smoker    Packs/day: 0.25    Types: Cigarettes  . Smokeless tobacco: Never Used  Substance and Sexual Activity  . Alcohol use: Yes    Comment: Socially  . Drug use: Yes    Types: Marijuana    Comment: daily  . Sexual activity: Yes    Birth control/protection: Implant  Lifestyle  . Physical activity:   Days per week: Not on file    Minutes per session: Not on file  . Stress: Not on file  Relationships  . Social connections:    Talks on phone: Not on file    Gets together: Not on file    Attends religious service: Not on file    Active member of club or organization: Not on file    Attends meetings of clubs or organizations: Not on file    Relationship status: Not on file  . Intimate partner violence:    Fear of current or ex partner: Not on file    Emotionally abused: Not on file    Physically abused: Not on file    Forced sexual activity: Not on file  Other Topics Concern  . Not on file  Social History Narrative  . Not on file    Past Surgical History:  Procedure Laterality Date  . NO PAST SURGERIES      Family History  Problem Relation Age of Onset  . Diabetes Mother   . Hypertension Mother   . Depression Mother   . Anxiety disorder Mother   . Diabetes Father   . Hypertension Father   .  Hyperlipidemia Father   . Personality disorder Father   . Cancer Maternal Grandmother        Appendix?  . Cancer Maternal Grandfather        Skin ?    Allergies  Allergen Reactions  . Augmentin [Amoxicillin-Pot Clavulanate]     hives  . Garlic Powder [Garlic] Hives  . Histamine Hives  . Watermelon Flavor Hives    "watermelon fruit"    Current Outpatient Medications on File Prior to Visit  Medication Sig Dispense Refill  . aspirin-acetaminophen-caffeine (EXCEDRIN MIGRAINE) 250-250-65 MG tablet Take 1 tablet by mouth every 6 (six) hours as needed for headache.    . etonogestrel (NEXPLANON) 68 MG IMPL implant Inject 1 each into the skin once. Reported on 11/03/2015    . HYDROcodone-homatropine (HYCODAN) 5-1.5 MG/5ML syrup Take 5 mLs by mouth every 6 (six) hours as needed for cough. 120 mL 0  . metroNIDAZOLE (FLAGYL) 500 MG tablet Take 1 tablet (500 mg total) by mouth 2 (two) times daily. 14 tablet 0  . mupirocin nasal ointment (BACTROBAN) 2 % Apply in each nostril daily 1 g 0   . SUMAtriptan (IMITREX) 100 MG tablet Take 1 tablet at the onset of migraine symptoms. May repeat one time in 2 hours if headache persists or recurs. 20 tablet 0   No current facility-administered medications on file prior to visit.     BP 126/78   Temp 98.7 F (37.1 C)       Objective:   Physical Exam Vitals signs and nursing note reviewed.  Constitutional:      Appearance: Normal appearance.  HENT:     Head: Normocephalic and atraumatic.     Right Ear: Tympanic membrane, ear canal and external ear normal. There is no impacted cerumen.     Left Ear: Tympanic membrane, ear canal and external ear normal. There is no impacted cerumen.     Nose: Congestion present.     Right Turbinates: Enlarged and swollen.     Left Turbinates: Enlarged and swollen.     Right Sinus: Maxillary sinus tenderness and frontal sinus tenderness present.     Left Sinus: Maxillary sinus tenderness and frontal sinus tenderness present.     Mouth/Throat:     Mouth: Mucous membranes are moist.  Cardiovascular:     Rate and Rhythm: Normal rate and regular rhythm.     Pulses: Normal pulses.     Heart sounds: Normal heart sounds.  Pulmonary:     Effort: Pulmonary effort is normal.     Breath sounds: Normal breath sounds.  Neurological:     Mental Status: She is alert.  Psychiatric:        Mood and Affect: Mood normal.        Behavior: Behavior normal.        Thought Content: Thought content normal.        Judgment: Judgment normal.       Assessment & Plan:  1. Upper respiratory tract infection, unspecified type - Not concerned for Covid-19 at this time. Likely URI/Sinusitis exacerbated shortness of breath by smoking   - doxycycline (VIBRAMYCIN) 100 MG capsule; Take 1 capsule (100 mg total) by mouth 2 (two) times daily.  Dispense: 14 capsule; Refill: 0 - predniSONE (DELTASONE) 10 MG tablet; 40 mg x 3 days, 20 mg x 3 days, 10 mg x 3 days  Dispense: 21 tablet; Refill: 0 -She is working from home for  the next 6 weeks, and will advised to self  quarantine times have resolved.  Shirline Frees, NP

## 2018-11-14 ENCOUNTER — Telehealth: Payer: Self-pay | Admitting: Adult Health

## 2018-11-14 ENCOUNTER — Other Ambulatory Visit: Payer: Self-pay

## 2018-11-14 MED ORDER — BENZONATATE 100 MG PO CAPS: 100.0000 mg | ORAL_CAPSULE | Freq: Three times a day (TID) | ORAL | 0 refills | Status: DC | PRN

## 2018-11-14 NOTE — Telephone Encounter (Signed)
Kandee Keen is out of the office.

## 2018-11-14 NOTE — Telephone Encounter (Signed)
Medication has been sent.  

## 2018-11-14 NOTE — Telephone Encounter (Signed)
Copied from CRM [id number]. Topic: General - Other >> Nov 14, 2018 12:46 PM Leafy Ro wrote: Reason for CRM:pt saw cory on 11-11-2018 and would like cough med sent to [facility]

## 2018-11-14 NOTE — Telephone Encounter (Signed)
Can send in Tessalon Perles 100 mg po tid prn cough #30 with 0 refills.

## 2018-11-14 NOTE — Telephone Encounter (Signed)
Please advise 

## 2018-11-17 ENCOUNTER — Encounter: Payer: Self-pay | Admitting: Adult Health

## 2018-11-18 ENCOUNTER — Other Ambulatory Visit: Payer: Self-pay | Admitting: Adult Health

## 2018-11-18 MED ORDER — FLUCONAZOLE 150 MG PO TABS: 150.0000 mg | ORAL_TABLET | Freq: Once | ORAL | 0 refills | Status: AC

## 2018-12-01 ENCOUNTER — Telehealth: Payer: Self-pay | Admitting: Family Medicine

## 2018-12-01 NOTE — Telephone Encounter (Signed)
Left a detailed voicemail message of how to download the app and also informed the patient the visit will be a virtual visit.

## 2018-12-02 ENCOUNTER — Other Ambulatory Visit: Payer: Self-pay

## 2018-12-02 ENCOUNTER — Telehealth: Payer: Self-pay

## 2018-12-02 ENCOUNTER — Telehealth: Payer: Self-pay | Admitting: Adult Health

## 2018-12-02 ENCOUNTER — Ambulatory Visit: Payer: BLUE CROSS/BLUE SHIELD | Admitting: Medical

## 2018-12-02 NOTE — Progress Notes (Unsigned)
Can we schedule pt in May for Nexplanon removal and Insertion, her [facility] has expired. Left Pt VM to explain cannot do until May. Pt also has My Chart.

## 2018-12-02 NOTE — Telephone Encounter (Signed)
Left message to call and make appt

## 2018-12-02 NOTE — Telephone Encounter (Signed)
Called pt to advise that it will be in May before she can get an appointment to get her Nexplanon removed & replaced. Advised even though expired, still can waite. Advised new appointment will open in May. No answer, left VM.

## 2018-12-02 NOTE — Telephone Encounter (Signed)
Pt would like to see if she can re-establish with this office and to also come in for a nexplanon insertion. Please advise.

## 2018-12-03 ENCOUNTER — Ambulatory Visit (INDEPENDENT_AMBULATORY_CARE_PROVIDER_SITE_OTHER): Payer: 59 | Admitting: Adult Health

## 2018-12-03 ENCOUNTER — Encounter: Payer: Self-pay | Admitting: Adult Health

## 2018-12-03 ENCOUNTER — Other Ambulatory Visit: Payer: Self-pay

## 2018-12-03 ENCOUNTER — Ambulatory Visit: Payer: BLUE CROSS/BLUE SHIELD | Admitting: Obstetrics & Gynecology

## 2018-12-03 VITALS — BP 129/97 | HR 84 | Ht 62.0 in | Wt 209.0 lb

## 2018-12-03 DIAGNOSIS — F329 Major depressive disorder, single episode, unspecified: Secondary | ICD-10-CM | POA: Insufficient documentation

## 2018-12-03 DIAGNOSIS — F32A Depression, unspecified: Secondary | ICD-10-CM

## 2018-12-03 DIAGNOSIS — Z3046 Encounter for surveillance of implantable subdermal contraceptive: Secondary | ICD-10-CM | POA: Diagnosis not present

## 2018-12-03 DIAGNOSIS — Z975 Presence of (intrauterine) contraceptive device: Secondary | ICD-10-CM | POA: Insufficient documentation

## 2018-12-03 DIAGNOSIS — Z3202 Encounter for pregnancy test, result negative: Secondary | ICD-10-CM | POA: Insufficient documentation

## 2018-12-03 DIAGNOSIS — Z30017 Encounter for initial prescription of implantable subdermal contraceptive: Secondary | ICD-10-CM | POA: Insufficient documentation

## 2018-12-03 LAB — POCT URINE PREGNANCY: Preg Test, Ur: NEGATIVE

## 2018-12-03 MED ORDER — SERTRALINE HCL 50 MG PO TABS: 50.0000 mg | ORAL_TABLET | Freq: Every day | ORAL | 3 refills | Status: DC

## 2018-12-03 NOTE — Progress Notes (Addendum)
Patient ID: Jacqueline Erickson, female   DOB: 1990-11-02, 28 y.o.   MRN: [id number] History of Present Illness: Jacqueline Erickson is a 28 year old black female, single in for nexplanon removal, and reinsertion.She had last nexplanon inserted 11/03/15. PCP is Shirline Frees, NP.   Current Medications, Allergies, Past Medical History, Past Surgical History, Family History and Social History were reviewed in [employer] record.     Review of Systems: For nexplanon removal and reinsertion.    Physical Exam:BP (!) 129/97 (BP Location: Left Arm, Patient Position: Sitting, Cuff Size: Normal)   Pulse 84   Ht 5\' 2"  (1.575 m)   Wt 209 lb (94.8 kg)   LMP 12/03/2018   BMI 38.23 kg/m  UPT negative. General:  Well developed, well nourished, no acute distress Skin:  Warm and dry Psych:  No mood changes, alert and cooperative,seems happy Fall risk is low. PHQ 9 score 9, denies being suicidal and is open to taking meds, will Rx zoloft, as she says lexapro make her feeling different in the past. She had sex with condom today, and is spotting like period.She is aware that if by chance she is pregnant will have to remove nexplanon. Consent signed and time out called. Left arm cleansed with betadine, and injected with 2.0 cc 1% lidocaine and waited til numb.Under sterile technique a #11 blade was used to make small vertical incision, and a curved forceps was used to easily remove rod. Then new rod inserted and palpated by provider and pt. Steri strips applied. Pressure dressing applied.  Impression:  1. Encounter for Nexplanon removal      2. Nexplanon insertion Lot # [id number] Exp [id number] - POCT urine pregnancy Recheck HPT in about 4 weeks Use condoms x 2 weeks, keep clean and dry x 24 hours, no heavy lifting, keep steri strips on x 72 hours, Keep pressure dressing on x 24 hours. Follow up prn problems.  3. Urine pregnancy test negative  4. Depression, unspecified depression type  Will start Zoloft 50 mg, can take a half tablet for few days then 1 daily  Meds ordered this encounter  Medications  . sertraline (ZOLOFT) 50 MG tablet    Sig: Take 1 tablet (50 mg total) by mouth daily.    Dispense:  30 tablet    Refill:  3    Order Specific Question:   Supervising Provider    Answer:   Duane Lope H [[id number]]   F/U in 8 weeks  Pap due  in June

## 2018-12-03 NOTE — Patient Instructions (Signed)
Use condoms x 2 weeks, keep clean and dry x 24 hours, no heavy lifting, keep steri strips on x 72 hours, Keep pressure dressing on x 24 hours. Follow up prn problems. F/U 8 weeks

## 2018-12-04 MED ORDER — ETONOGESTREL 68 MG ~~LOC~~ IMPL
68.0000 mg | DRUG_IMPLANT | Freq: Once | SUBCUTANEOUS | Status: AC
  Administered 2018-12-03: 15:00:00 68 mg via SUBCUTANEOUS

## 2018-12-04 NOTE — Addendum Note (Signed)
Addended by: Sherre Lain A on: 12/04/2018 02:47 PM   Modules accepted: Orders

## 2018-12-08 DIAGNOSIS — Z3046 Encounter for surveillance of implantable subdermal contraceptive: Secondary | ICD-10-CM

## 2018-12-08 MED ORDER — ETONOGESTREL 68 MG ~~LOC~~ IMPL
68.0000 mg | DRUG_IMPLANT | Freq: Once | SUBCUTANEOUS | Status: AC
  Administered 2018-12-08: 09:00:00 68 mg via SUBCUTANEOUS

## 2018-12-08 NOTE — Addendum Note (Signed)
Addended by: Moss Mc on: 12/08/2018 09:33 AM   Modules accepted: Orders

## 2019-01-27 ENCOUNTER — Encounter: Payer: Self-pay | Admitting: *Deleted

## 2019-01-28 ENCOUNTER — Ambulatory Visit: Payer: 59 | Admitting: Adult Health

## 2019-03-30 ENCOUNTER — Other Ambulatory Visit: Payer: Self-pay

## 2019-03-30 ENCOUNTER — Emergency Department (HOSPITAL_COMMUNITY)
Admission: EM | Admit: 2019-03-30 | Discharge: 2019-03-30 | Disposition: A | Discharge status: Home / Self Care | Payer: 59 | Attending: Emergency Medicine | Admitting: Emergency Medicine

## 2019-03-30 ENCOUNTER — Encounter (HOSPITAL_COMMUNITY): Payer: Self-pay | Admitting: *Deleted

## 2019-03-30 DIAGNOSIS — M549 Dorsalgia, unspecified: Secondary | ICD-10-CM | POA: Diagnosis present

## 2019-03-30 DIAGNOSIS — M7918 Myalgia, other site: Secondary | ICD-10-CM

## 2019-03-30 DIAGNOSIS — F17210 Nicotine dependence, cigarettes, uncomplicated: Secondary | ICD-10-CM | POA: Diagnosis not present

## 2019-03-30 DIAGNOSIS — Z79899 Other long term (current) drug therapy: Secondary | ICD-10-CM | POA: Diagnosis not present

## 2019-03-30 MED ORDER — METHOCARBAMOL 500 MG PO TABS: 500.0000 mg | ORAL_TABLET | Freq: Two times a day (BID) | ORAL | 0 refills | Status: AC

## 2019-03-30 MED ORDER — LIDOCAINE 5 % EX PTCH: 1.0000 | MEDICATED_PATCH | CUTANEOUS | 0 refills | Status: DC
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
## 2019-03-30 NOTE — ED Notes (Signed)
Patient verbalized understanding of dc instructions, vss, ambulatory with nad.   
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
## 2019-04-12 ENCOUNTER — Encounter (HOSPITAL_COMMUNITY): Payer: Self-pay

## 2019-04-12 ENCOUNTER — Emergency Department (HOSPITAL_COMMUNITY)
Admission: EM | Admit: 2019-04-12 | Discharge: 2019-04-13 | Disposition: A | Discharge status: Home / Self Care | Payer: 59 | Attending: Emergency Medicine | Admitting: Emergency Medicine

## 2019-04-12 ENCOUNTER — Other Ambulatory Visit: Payer: Self-pay

## 2019-04-12 DIAGNOSIS — M545 Low back pain, unspecified: Secondary | ICD-10-CM

## 2019-04-12 DIAGNOSIS — F17210 Nicotine dependence, cigarettes, uncomplicated: Secondary | ICD-10-CM | POA: Insufficient documentation

## 2019-04-12 DIAGNOSIS — M546 Pain in thoracic spine: Secondary | ICD-10-CM | POA: Insufficient documentation
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
## 2019-04-13 ENCOUNTER — Other Ambulatory Visit: Payer: Self-pay

## 2019-04-13 MED ORDER — IBUPROFEN 800 MG PO TABS
800.0000 mg | ORAL_TABLET | Freq: Once | ORAL | Status: AC
  Administered 2019-04-13: 800 mg via ORAL
  Filled 2019-04-13: qty 1

## 2019-04-13 MED ORDER — CYCLOBENZAPRINE HCL 10 MG PO TABS: 10.0000 mg | ORAL_TABLET | Freq: Two times a day (BID) | ORAL | 0 refills | Status: DC | PRN

## 2019-04-13 MED ORDER — MELOXICAM 15 MG PO TABS: 15.0000 mg | ORAL_TABLET | Freq: Every day | ORAL | 0 refills | Status: DC
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
## 2019-04-13 NOTE — ED Notes (Signed)
Patient verbalizes understanding of discharge instructions. Opportunity for questioning and answers were provided. Armband removed by staff, pt discharged from ED.  

## 2019-04-13 NOTE — Discharge Instructions (Addendum)
1. Medications: mobic, flexeril, usual home medications 2. Treatment: rest, drink plenty of fluids, gentle stretching as discussed, alternate ice and heat 3. Follow Up: Please followup with your primary doctor in 3 days for discussion of your diagnoses and further evaluation after today's visit; if you do not have a primary care doctor use the resource guide provided to find one;  Return to the ER for worsening back pain, difficulty walking, loss of bowel or bladder control or other concerning symptoms

## 2019-05-13 ENCOUNTER — Other Ambulatory Visit: Payer: Self-pay

## 2019-05-13 ENCOUNTER — Ambulatory Visit (INDEPENDENT_AMBULATORY_CARE_PROVIDER_SITE_OTHER): Payer: 59 | Admitting: Adult Health

## 2019-05-13 ENCOUNTER — Encounter: Payer: Self-pay | Admitting: Adult Health

## 2019-05-13 VITALS — BP 124/70 | HR 106 | Temp 98.2°F | Wt 227.2 lb

## 2019-05-13 DIAGNOSIS — Z23 Encounter for immunization: Secondary | ICD-10-CM | POA: Diagnosis not present

## 2019-05-13 DIAGNOSIS — M549 Dorsalgia, unspecified: Secondary | ICD-10-CM

## 2019-05-13 MED ORDER — METHYLPREDNISOLONE 4 MG PO TBPK: ORAL_TABLET | ORAL | 0 refills | Status: DC
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
## 2019-07-24 ENCOUNTER — Ambulatory Visit (INDEPENDENT_AMBULATORY_CARE_PROVIDER_SITE_OTHER): Payer: HRSA Program | Admitting: Family Medicine

## 2019-07-24 ENCOUNTER — Other Ambulatory Visit: Payer: Self-pay

## 2019-07-24 DIAGNOSIS — Z20828 Contact with and (suspected) exposure to other viral communicable diseases: Secondary | ICD-10-CM

## 2019-07-24 DIAGNOSIS — Z20822 Contact with and (suspected) exposure to covid-19: Secondary | ICD-10-CM
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
## 2019-10-05 ENCOUNTER — Encounter (HOSPITAL_COMMUNITY): Payer: Self-pay | Admitting: Emergency Medicine

## 2019-10-05 ENCOUNTER — Emergency Department (HOSPITAL_COMMUNITY): Payer: 59

## 2019-10-05 ENCOUNTER — Inpatient Hospital Stay (HOSPITAL_COMMUNITY)
Admission: EM | Admit: 2019-10-05 | Discharge: 2019-10-19 | DRG: 488 | Disposition: A | Discharge status: Home Health | Payer: 59 | Attending: Physician Assistant | Admitting: Physician Assistant

## 2019-10-05 DIAGNOSIS — I959 Hypotension, unspecified: Secondary | ICD-10-CM | POA: Diagnosis present

## 2019-10-05 DIAGNOSIS — S0003XA Contusion of scalp, initial encounter: Secondary | ICD-10-CM | POA: Diagnosis present

## 2019-10-05 DIAGNOSIS — R350 Frequency of micturition: Secondary | ICD-10-CM | POA: Diagnosis present

## 2019-10-05 DIAGNOSIS — S83104A Unspecified dislocation of right knee, initial encounter: Secondary | ICD-10-CM | POA: Diagnosis present

## 2019-10-05 DIAGNOSIS — Z20822 Contact with and (suspected) exposure to covid-19: Secondary | ICD-10-CM | POA: Diagnosis present

## 2019-10-05 DIAGNOSIS — Z560 Unemployment, unspecified: Secondary | ICD-10-CM

## 2019-10-05 DIAGNOSIS — Y92410 Unspecified street and highway as the place of occurrence of the external cause: Secondary | ICD-10-CM

## 2019-10-05 DIAGNOSIS — S32019A Unspecified fracture of first lumbar vertebra, initial encounter for closed fracture: Secondary | ICD-10-CM | POA: Diagnosis present

## 2019-10-05 DIAGNOSIS — S40811A Abrasion of right upper arm, initial encounter: Secondary | ICD-10-CM | POA: Diagnosis present

## 2019-10-05 DIAGNOSIS — D62 Acute posthemorrhagic anemia: Secondary | ICD-10-CM | POA: Diagnosis present

## 2019-10-05 DIAGNOSIS — S83241A Other tear of medial meniscus, current injury, right knee, initial encounter: Principal | ICD-10-CM | POA: Diagnosis present

## 2019-10-05 DIAGNOSIS — S80212A Abrasion, left knee, initial encounter: Secondary | ICD-10-CM | POA: Diagnosis present

## 2019-10-05 DIAGNOSIS — R21 Rash and other nonspecific skin eruption: Secondary | ICD-10-CM | POA: Diagnosis present

## 2019-10-05 DIAGNOSIS — S0081XA Abrasion of other part of head, initial encounter: Secondary | ICD-10-CM | POA: Diagnosis present

## 2019-10-05 DIAGNOSIS — Z79899 Other long term (current) drug therapy: Secondary | ICD-10-CM

## 2019-10-05 DIAGNOSIS — T07XXXA Unspecified multiple injuries, initial encounter: Secondary | ICD-10-CM

## 2019-10-05 DIAGNOSIS — F17210 Nicotine dependence, cigarettes, uncomplicated: Secondary | ICD-10-CM | POA: Diagnosis present

## 2019-10-05 DIAGNOSIS — S32029A Unspecified fracture of second lumbar vertebra, initial encounter for closed fracture: Secondary | ICD-10-CM | POA: Diagnosis present

## 2019-10-05 DIAGNOSIS — Z09 Encounter for follow-up examination after completed treatment for conditions other than malignant neoplasm: Secondary | ICD-10-CM

## 2019-10-05 DIAGNOSIS — S01311A Laceration without foreign body of right ear, initial encounter: Secondary | ICD-10-CM | POA: Diagnosis present

## 2019-10-05 DIAGNOSIS — S40211A Abrasion of right shoulder, initial encounter: Secondary | ICD-10-CM | POA: Diagnosis present

## 2019-10-05 DIAGNOSIS — S80211A Abrasion, right knee, initial encounter: Secondary | ICD-10-CM | POA: Diagnosis present

## 2019-10-05 DIAGNOSIS — F10129 Alcohol abuse with intoxication, unspecified: Secondary | ICD-10-CM | POA: Diagnosis present

## 2019-10-05 DIAGNOSIS — S32009A Unspecified fracture of unspecified lumbar vertebra, initial encounter for closed fracture: Secondary | ICD-10-CM

## 2019-10-05 DIAGNOSIS — Y908 Blood alcohol level of 240 mg/100 ml or more: Secondary | ICD-10-CM | POA: Diagnosis present

## 2019-10-05 DIAGNOSIS — D72829 Elevated white blood cell count, unspecified: Secondary | ICD-10-CM

## 2019-10-05 DIAGNOSIS — R319 Hematuria, unspecified: Secondary | ICD-10-CM | POA: Diagnosis not present

## 2019-10-05 DIAGNOSIS — F10920 Alcohol use, unspecified with intoxication, uncomplicated: Secondary | ICD-10-CM

## 2019-10-05 DIAGNOSIS — S30811A Abrasion of abdominal wall, initial encounter: Secondary | ICD-10-CM | POA: Diagnosis present

## 2019-10-05 DIAGNOSIS — F329 Major depressive disorder, single episode, unspecified: Secondary | ICD-10-CM | POA: Diagnosis present

## 2019-10-05 DIAGNOSIS — Z6841 Body Mass Index (BMI) 40.0 and over, adult: Secondary | ICD-10-CM

## 2019-10-05 DIAGNOSIS — H9190 Unspecified hearing loss, unspecified ear: Secondary | ICD-10-CM | POA: Diagnosis present

## 2019-10-05 DIAGNOSIS — S0001XA Abrasion of scalp, initial encounter: Secondary | ICD-10-CM | POA: Diagnosis present

## 2019-10-05 DIAGNOSIS — S20419A Abrasion of unspecified back wall of thorax, initial encounter: Secondary | ICD-10-CM | POA: Diagnosis present

## 2019-10-05 DIAGNOSIS — N179 Acute kidney failure, unspecified: Secondary | ICD-10-CM | POA: Diagnosis present

## 2019-10-05 HISTORY — PX: LACERATION REPAIR: PRO83

## 2019-10-05 HISTORY — DX: Migraine, unspecified, not intractable, without status migrainosus: G43.909

## 2019-10-05 LAB — ETHANOL: Alcohol, Ethyl (B): 257 mg/dL — ABNORMAL HIGH (ref <10)

## 2019-10-05 LAB — COMPREHENSIVE METABOLIC PANEL
ALT: 38 U/L (ref 0–44)
AST: 70 U/L — ABNORMAL HIGH (ref 15–41)
Albumin: 3.8 g/dL (ref 3.5–5.0)
Alkaline Phosphatase: 68 U/L (ref 38–126)
Anion gap: 19 — ABNORMAL HIGH (ref 5–15)
BUN: 10 mg/dL (ref 6–20)
CO2: 15 mmol/L — ABNORMAL LOW (ref 22–32)
Calcium: 8.7 mg/dL — ABNORMAL LOW (ref 8.9–10.3)
Chloride: 104 mmol/L (ref 98–111)
Creatinine, Ser: 1.22 mg/dL — ABNORMAL HIGH (ref 0.44–1.00)
GFR calc Af Amer: >60 mL/min (ref >60)
GFR calc non Af Amer: >60 mL/min (ref >60)
Glucose, Bld: 135 mg/dL — ABNORMAL HIGH (ref 70–99)
Potassium: 3.7 mmol/L (ref 3.5–5.1)
Sodium: 138 mmol/L (ref 135–145)
Total Bilirubin: 1.4 mg/dL — ABNORMAL HIGH (ref 0.3–1.2)
Total Protein: 7.2 g/dL (ref 6.5–8.1)

## 2019-10-05 LAB — I-STAT CHEM 8, ED
BUN: 14 mg/dL (ref 6–20)
Calcium, Ion: 1.05 mmol/L — ABNORMAL LOW (ref 1.15–1.40)
Chloride: 108 mmol/L (ref 98–111)
Creatinine, Ser: 1.5 mg/dL — ABNORMAL HIGH (ref 0.44–1.00)
Glucose, Bld: 129 mg/dL — ABNORMAL HIGH (ref 70–99)
HCT: 44 % (ref 36.0–46.0)
Hemoglobin: 15 g/dL (ref 12.0–15.0)
Potassium: 3.7 mmol/L (ref 3.5–5.1)
Sodium: 140 mmol/L (ref 135–145)
TCO2: 18 mmol/L — ABNORMAL LOW (ref 22–32)

## 2019-10-05 LAB — I-STAT BETA HCG BLOOD, ED (MC, WL, AP ONLY): I-stat hCG, quantitative: <5 m[IU]/mL (ref <5)

## 2019-10-05 LAB — RESPIRATORY PANEL BY RT PCR (FLU A&B, COVID)
Influenza A by PCR: NEGATIVE
Influenza B by PCR: NEGATIVE
SARS Coronavirus 2 by RT PCR: NEGATIVE

## 2019-10-05 LAB — PROTIME-INR
INR: 1 (ref 0.8–1.2)
Prothrombin Time: 13.2 seconds (ref 11.4–15.2)

## 2019-10-05 LAB — SAMPLE TO BLOOD BANK

## 2019-10-05 LAB — CBC
HCT: 44.2 % (ref 36.0–46.0)
Hemoglobin: 14.5 g/dL (ref 12.0–15.0)
MCH: 32.2 pg (ref 26.0–34.0)
MCHC: 32.8 g/dL (ref 30.0–36.0)
MCV: 98 fL (ref 80.0–100.0)
Platelets: 340 10*3/uL (ref 150–400)
RBC: 4.51 MIL/uL (ref 3.87–5.11)
RDW: 13 % (ref 11.5–15.5)
WBC: 25.3 10*3/uL — ABNORMAL HIGH (ref 4.0–10.5)
nRBC: 0 % (ref 0.0–0.2)

## 2019-10-05 LAB — BASIC METABOLIC PANEL
Anion gap: 9 (ref 5–15)
BUN: 9 mg/dL (ref 6–20)
CO2: 19 mmol/L — ABNORMAL LOW (ref 22–32)
Calcium: 8.6 mg/dL — ABNORMAL LOW (ref 8.9–10.3)
Chloride: 106 mmol/L (ref 98–111)
Creatinine, Ser: 0.79 mg/dL (ref 0.44–1.00)
GFR calc Af Amer: >60 mL/min (ref >60)
GFR calc non Af Amer: >60 mL/min (ref >60)
Glucose, Bld: 137 mg/dL — ABNORMAL HIGH (ref 70–99)
Potassium: 3.7 mmol/L (ref 3.5–5.1)
Sodium: 134 mmol/L — ABNORMAL LOW (ref 135–145)

## 2019-10-05 LAB — CDS SEROLOGY

## 2019-10-05 LAB — PHOSPHORUS: Phosphorus: 4.4 mg/dL (ref 2.5–4.6)

## 2019-10-05 LAB — MRSA PCR SCREENING: MRSA by PCR: NEGATIVE

## 2019-10-05 LAB — LACTIC ACID, PLASMA: Lactic Acid, Venous: 5.4 mmol/L (ref 0.5–1.9)

## 2019-10-05 LAB — MAGNESIUM: Magnesium: 1.7 mg/dL (ref 1.7–2.4)

## 2019-10-05 MED ORDER — LORAZEPAM 1 MG PO TABS: 1.0000 mg | ORAL_TABLET | ORAL | Status: AC | PRN

## 2019-10-05 MED ORDER — FOLIC ACID 1 MG PO TABS
1.0000 mg | ORAL_TABLET | Freq: Every day | ORAL | Status: DC
  Administered 2019-10-05: 12:00:00 1 mg via ORAL
  Filled 2019-10-05: qty 1

## 2019-10-05 MED ORDER — ADULT MULTIVITAMIN W/MINERALS CH
1.0000 | ORAL_TABLET | Freq: Every day | ORAL | Status: DC
  Administered 2019-10-05 – 2019-10-19 (×14): 1 via ORAL
  Filled 2019-10-05 (×15): qty 1

## 2019-10-05 MED ORDER — FOLIC ACID 5 MG/ML IJ SOLN
1.0000 mg | Freq: Every day | INTRAMUSCULAR | Status: DC
  Administered 2019-10-05: 20:00:00 1 mg via INTRAVENOUS
  Filled 2019-10-05 (×4): qty 0.2

## 2019-10-05 MED ORDER — FENTANYL CITRATE (PF) 100 MCG/2ML IJ SOLN
INTRAMUSCULAR | Status: AC
  Administered 2019-10-05: 03:00:00 50 ug via INTRAVENOUS
  Filled 2019-10-05: qty 2

## 2019-10-05 MED ORDER — OXYCODONE HCL 5 MG PO TABS
5.0000 mg | ORAL_TABLET | ORAL | Status: DC | PRN
  Administered 2019-10-05 (×4): 10 mg via ORAL
  Filled 2019-10-05 (×4): qty 2

## 2019-10-05 MED ORDER — ONDANSETRON 4 MG PO TBDP
4.0000 mg | ORAL_TABLET | Freq: Once | ORAL | Status: DC
  Filled 2019-10-05 (×3): qty 1

## 2019-10-05 MED ORDER — SODIUM CHLORIDE 0.9 % IV SOLN: INTRAVENOUS | Status: DC

## 2019-10-05 MED ORDER — LIDOCAINE HCL (PF) 1 % IJ SOLN
INTRAMUSCULAR | Status: AC
  Filled 2019-10-05: qty 5

## 2019-10-05 MED ORDER — DOUBLE ANTIBIOTIC 500-10000 UNIT/GM EX OINT
TOPICAL_OINTMENT | Freq: Two times a day (BID) | CUTANEOUS | Status: AC
  Administered 2019-10-06: 1 via TOPICAL
  Filled 2019-10-05 (×2): qty 28.4

## 2019-10-05 MED ORDER — LIDOCAINE HCL 1 % IJ SOLN
5.0000 mL | Freq: Once | INTRAMUSCULAR | Status: DC
  Filled 2019-10-05: qty 5

## 2019-10-05 MED ORDER — METOPROLOL TARTRATE 5 MG/5ML IV SOLN
5.0000 mg | Freq: Four times a day (QID) | INTRAVENOUS | Status: DC | PRN
  Administered 2019-10-05 – 2019-10-12 (×5): 5 mg via INTRAVENOUS
  Filled 2019-10-05 (×4): qty 5

## 2019-10-05 MED ORDER — METHOCARBAMOL 1000 MG/10ML IJ SOLN
500.0000 mg | Freq: Three times a day (TID) | INTRAVENOUS | Status: DC | PRN
  Administered 2019-10-05: 08:00:00 500 mg via INTRAVENOUS
  Filled 2019-10-05: qty 5

## 2019-10-05 MED ORDER — PROCHLORPERAZINE MALEATE 10 MG PO TABS
10.0000 mg | ORAL_TABLET | Freq: Four times a day (QID) | ORAL | Status: DC | PRN
  Filled 2019-10-05: qty 1

## 2019-10-05 MED ORDER — FENTANYL CITRATE (PF) 100 MCG/2ML IJ SOLN: 50.0000 ug | Freq: Once | INTRAMUSCULAR | Status: AC

## 2019-10-05 MED ORDER — SODIUM CHLORIDE 0.9 % IV BOLUS (SEPSIS)
1000.0000 mL | Freq: Once | INTRAVENOUS | Status: AC
  Administered 2019-10-05: 03:00:00 1000 mL via INTRAVENOUS

## 2019-10-05 MED ORDER — KCL IN DEXTROSE-NACL 20-5-0.45 MEQ/L-%-% IV SOLN
INTRAVENOUS | Status: DC
  Filled 2019-10-05: qty 1000

## 2019-10-05 MED ORDER — FENTANYL CITRATE (PF) 100 MCG/2ML IJ SOLN
INTRAMUSCULAR | Status: AC
  Filled 2019-10-05: qty 2

## 2019-10-05 MED ORDER — ACETAMINOPHEN 325 MG PO TABS: 650.0000 mg | ORAL_TABLET | Freq: Four times a day (QID) | ORAL | Status: DC | PRN

## 2019-10-05 MED ORDER — METHOCARBAMOL 500 MG PO TABS
500.0000 mg | ORAL_TABLET | Freq: Three times a day (TID) | ORAL | Status: DC | PRN
  Administered 2019-10-05: 22:00:00 500 mg via ORAL
  Filled 2019-10-05: qty 1

## 2019-10-05 MED ORDER — PANTOPRAZOLE SODIUM 40 MG IV SOLR
40.0000 mg | Freq: Every day | INTRAVENOUS | Status: DC
  Administered 2019-10-05 – 2019-10-06 (×2): 40 mg via INTRAVENOUS
  Filled 2019-10-05 (×2): qty 40

## 2019-10-05 MED ORDER — MORPHINE SULFATE (PF) 2 MG/ML IV SOLN
2.0000 mg | INTRAVENOUS | Status: DC | PRN
  Administered 2019-10-05 – 2019-10-06 (×4): 2 mg via INTRAVENOUS
  Filled 2019-10-05 (×4): qty 1

## 2019-10-05 MED ORDER — FENTANYL CITRATE (PF) 100 MCG/2ML IJ SOLN
INTRAMUSCULAR | Status: AC | PRN
  Administered 2019-10-05: 50 ug via INTRAVENOUS

## 2019-10-05 MED ORDER — ACETAMINOPHEN 325 MG PO TABS: 650.0000 mg | ORAL_TABLET | ORAL | Status: DC | PRN

## 2019-10-05 MED ORDER — THIAMINE HCL 100 MG PO TABS
100.0000 mg | ORAL_TABLET | Freq: Every day | ORAL | Status: DC
  Administered 2019-10-05: 12:00:00 100 mg via ORAL
  Filled 2019-10-05: qty 1

## 2019-10-05 MED ORDER — OFLOXACIN 0.3 % OP SOLN
4.0000 [drp] | Freq: Every day | OPHTHALMIC | Status: DC
  Administered 2019-10-05 – 2019-10-07 (×3): 4 [drp] via OTIC
  Filled 2019-10-05: qty 5

## 2019-10-05 MED ORDER — THIAMINE HCL 100 MG/ML IJ SOLN
100.0000 mg | Freq: Every day | INTRAMUSCULAR | Status: DC
  Administered 2019-10-05 – 2019-10-07 (×3): 100 mg via INTRAVENOUS
  Filled 2019-10-05 (×3): qty 2

## 2019-10-05 MED ORDER — ENOXAPARIN SODIUM 40 MG/0.4ML ~~LOC~~ SOLN: 40.0000 mg | SUBCUTANEOUS | Status: DC

## 2019-10-05 MED ORDER — PROCHLORPERAZINE EDISYLATE 10 MG/2ML IJ SOLN: 5.0000 mg | Freq: Four times a day (QID) | INTRAMUSCULAR | Status: DC | PRN

## 2019-10-05 MED ORDER — LORAZEPAM 2 MG/ML IJ SOLN: 1.0000 mg | INTRAMUSCULAR | Status: AC | PRN

## 2019-10-05 MED ORDER — METHOCARBAMOL 500 MG PO TABS: 500.0000 mg | ORAL_TABLET | Freq: Four times a day (QID) | ORAL | Status: DC | PRN

## 2019-10-05 MED ORDER — IOHEXOL 300 MG/ML  SOLN
100.0000 mL | Freq: Once | INTRAMUSCULAR | Status: AC | PRN
  Administered 2019-10-05: 03:00:00 100 mL via INTRAVENOUS

## 2019-10-05 MED ORDER — THIAMINE HCL 100 MG/ML IJ SOLN: 100.0000 mg | Freq: Every day | INTRAMUSCULAR | Status: DC

## 2019-10-05 MED ORDER — ENOXAPARIN SODIUM 40 MG/0.4ML ~~LOC~~ SOLN
40.0000 mg | SUBCUTANEOUS | Status: DC
  Administered 2019-10-05: 12:00:00 40 mg via SUBCUTANEOUS
  Filled 2019-10-05: qty 0.4

## 2019-10-05 MED ORDER — LACTATED RINGERS IV BOLUS
1000.0000 mL | Freq: Once | INTRAVENOUS | Status: AC
  Administered 2019-10-05: 1000 mL via INTRAVENOUS

## 2019-10-05 MED ORDER — ONDANSETRON HCL 4 MG/2ML IJ SOLN
4.0000 mg | Freq: Once | INTRAMUSCULAR | Status: AC
  Administered 2019-10-05: 4 mg via INTRAVENOUS
  Filled 2019-10-05: qty 2

## 2019-10-05 MED ORDER — TETANUS-DIPHTH-ACELL PERTUSSIS 5-2.5-18.5 LF-MCG/0.5 IM SUSP
0.5000 mL | Freq: Once | INTRAMUSCULAR | Status: AC
  Administered 2019-10-05: 0.5 mL via INTRAMUSCULAR
  Filled 2019-10-05: qty 0.5
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
## 2019-10-05 NOTE — ED Notes (Signed)
Attempted report 
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
## 2019-10-05 NOTE — Progress Notes (Signed)
Chaplain offered support to patient.  Tegan requested that chaplain call her mom.  Mom's number is [phone].  Chaplain spoke with mom and let her know that Cale was able to talk to her and give her, her number.  Chaplain let mom know that a medical professional will reach out to her.    Chaplain will follow-up as needed.  Jacqueline Erickson has now been taking for tests.
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
## 2019-10-05 NOTE — Progress Notes (Signed)
Orthopedic Tech Progress Note Patient Details:  Jacqueline Erickson [birth year] [id number]  Ortho Devices Type of Ortho Device: ASO Ortho Device/Splint Location: RLE Ortho Device/Splint Interventions: Application, Ordered   Post Interventions Patient Tolerated: Fair Instructions Provided: Care of device, Adjustment of device   [name] 10/05/2019, 3:55 PM
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
## 2019-10-05 NOTE — ED Notes (Signed)
Pt wounds cleaned, triple antibiotic ointment applied and vaseline dressings in place
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
## 2019-10-05 NOTE — ED Notes (Signed)
Pt to CT with RN

## 2019-10-05 NOTE — ED Notes (Signed)
Updated mother on plan of care.
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
## 2019-10-05 NOTE — ED Notes (Signed)
Bair hugger applied.

## 2019-10-06 DIAGNOSIS — R52 Pain, unspecified: Secondary | ICD-10-CM | POA: Diagnosis not present

## 2019-10-06 DIAGNOSIS — Z79899 Other long term (current) drug therapy: Secondary | ICD-10-CM | POA: Diagnosis not present

## 2019-10-06 DIAGNOSIS — S32029A Unspecified fracture of second lumbar vertebra, initial encounter for closed fracture: Secondary | ICD-10-CM | POA: Diagnosis present

## 2019-10-06 DIAGNOSIS — S83521S Sprain of posterior cruciate ligament of right knee, sequela: Secondary | ICD-10-CM | POA: Diagnosis not present

## 2019-10-06 DIAGNOSIS — S01311A Laceration without foreign body of right ear, initial encounter: Secondary | ICD-10-CM | POA: Diagnosis present

## 2019-10-06 DIAGNOSIS — S0081XA Abrasion of other part of head, initial encounter: Secondary | ICD-10-CM | POA: Diagnosis present

## 2019-10-06 DIAGNOSIS — I959 Hypotension, unspecified: Secondary | ICD-10-CM | POA: Diagnosis present

## 2019-10-06 DIAGNOSIS — S80212A Abrasion, left knee, initial encounter: Secondary | ICD-10-CM | POA: Diagnosis present

## 2019-10-06 DIAGNOSIS — S0001XA Abrasion of scalp, initial encounter: Secondary | ICD-10-CM | POA: Diagnosis present

## 2019-10-06 DIAGNOSIS — Z560 Unemployment, unspecified: Secondary | ICD-10-CM | POA: Diagnosis not present

## 2019-10-06 DIAGNOSIS — F329 Major depressive disorder, single episode, unspecified: Secondary | ICD-10-CM | POA: Diagnosis present

## 2019-10-06 DIAGNOSIS — T07XXXA Unspecified multiple injuries, initial encounter: Secondary | ICD-10-CM | POA: Diagnosis present

## 2019-10-06 DIAGNOSIS — Y92410 Unspecified street and highway as the place of occurrence of the external cause: Secondary | ICD-10-CM | POA: Diagnosis not present

## 2019-10-06 DIAGNOSIS — N179 Acute kidney failure, unspecified: Secondary | ICD-10-CM | POA: Diagnosis present

## 2019-10-06 DIAGNOSIS — Z6841 Body Mass Index (BMI) 40.0 and over, adult: Secondary | ICD-10-CM | POA: Diagnosis not present

## 2019-10-06 DIAGNOSIS — Z20822 Contact with and (suspected) exposure to covid-19: Secondary | ICD-10-CM | POA: Diagnosis present

## 2019-10-06 DIAGNOSIS — F10129 Alcohol abuse with intoxication, unspecified: Secondary | ICD-10-CM | POA: Diagnosis present

## 2019-10-06 DIAGNOSIS — M7989 Other specified soft tissue disorders: Secondary | ICD-10-CM | POA: Diagnosis not present

## 2019-10-06 DIAGNOSIS — F17210 Nicotine dependence, cigarettes, uncomplicated: Secondary | ICD-10-CM | POA: Diagnosis present

## 2019-10-06 DIAGNOSIS — S0003XA Contusion of scalp, initial encounter: Secondary | ICD-10-CM | POA: Diagnosis present

## 2019-10-06 DIAGNOSIS — Y908 Blood alcohol level of 240 mg/100 ml or more: Secondary | ICD-10-CM | POA: Diagnosis present

## 2019-10-06 DIAGNOSIS — S32019D Unspecified fracture of first lumbar vertebra, subsequent encounter for fracture with routine healing: Secondary | ICD-10-CM | POA: Diagnosis not present

## 2019-10-06 DIAGNOSIS — S83104A Unspecified dislocation of right knee, initial encounter: Secondary | ICD-10-CM | POA: Diagnosis present

## 2019-10-06 DIAGNOSIS — S83241A Other tear of medial meniscus, current injury, right knee, initial encounter: Secondary | ICD-10-CM | POA: Diagnosis present

## 2019-10-06 DIAGNOSIS — D62 Acute posthemorrhagic anemia: Secondary | ICD-10-CM | POA: Diagnosis present

## 2019-10-06 DIAGNOSIS — R21 Rash and other nonspecific skin eruption: Secondary | ICD-10-CM | POA: Diagnosis present

## 2019-10-06 DIAGNOSIS — S80211A Abrasion, right knee, initial encounter: Secondary | ICD-10-CM | POA: Diagnosis present

## 2019-10-06 DIAGNOSIS — S32019A Unspecified fracture of first lumbar vertebra, initial encounter for closed fracture: Secondary | ICD-10-CM | POA: Diagnosis present

## 2019-10-06 DIAGNOSIS — H9190 Unspecified hearing loss, unspecified ear: Secondary | ICD-10-CM | POA: Diagnosis present

## 2019-10-06 DIAGNOSIS — S40811A Abrasion of right upper arm, initial encounter: Secondary | ICD-10-CM | POA: Diagnosis present

## 2019-10-06 DIAGNOSIS — S83511A Sprain of anterior cruciate ligament of right knee, initial encounter: Secondary | ICD-10-CM | POA: Diagnosis not present

## 2019-10-06 LAB — CBC
HCT: 33.1 % — ABNORMAL LOW (ref 36.0–46.0)
HCT: 35.6 % — ABNORMAL LOW (ref 36.0–46.0)
Hemoglobin: 11 g/dL — ABNORMAL LOW (ref 12.0–15.0)
Hemoglobin: 11.9 g/dL — ABNORMAL LOW (ref 12.0–15.0)
MCH: 32.1 pg (ref 26.0–34.0)
MCH: 31.8 pg (ref 26.0–34.0)
MCHC: 33.2 g/dL (ref 30.0–36.0)
MCHC: 33.4 g/dL (ref 30.0–36.0)
MCV: 96.5 fL (ref 80.0–100.0)
MCV: 95.2 fL (ref 80.0–100.0)
Platelets: 254 10*3/uL (ref 150–400)
Platelets: 299 10*3/uL (ref 150–400)
RBC: 3.43 MIL/uL — ABNORMAL LOW (ref 3.87–5.11)
RBC: 3.74 MIL/uL — ABNORMAL LOW (ref 3.87–5.11)
RDW: 13 % (ref 11.5–15.5)
RDW: 12.6 % (ref 11.5–15.5)
WBC: 11.3 10*3/uL — ABNORMAL HIGH (ref 4.0–10.5)
WBC: 16 10*3/uL — ABNORMAL HIGH (ref 4.0–10.5)
nRBC: 0 % (ref 0.0–0.2)
nRBC: 0 % (ref 0.0–0.2)

## 2019-10-06 LAB — BASIC METABOLIC PANEL
Anion gap: 10 (ref 5–15)
BUN: 9 mg/dL (ref 6–20)
CO2: 20 mmol/L — ABNORMAL LOW (ref 22–32)
Calcium: 8.5 mg/dL — ABNORMAL LOW (ref 8.9–10.3)
Chloride: 103 mmol/L (ref 98–111)
Creatinine, Ser: 1.01 mg/dL — ABNORMAL HIGH (ref 0.44–1.00)
GFR calc Af Amer: >60 mL/min (ref >60)
GFR calc non Af Amer: >60 mL/min (ref >60)
Glucose, Bld: 120 mg/dL — ABNORMAL HIGH (ref 70–99)
Potassium: 3.4 mmol/L — ABNORMAL LOW (ref 3.5–5.1)
Sodium: 133 mmol/L — ABNORMAL LOW (ref 135–145)

## 2019-10-06 LAB — HIV ANTIBODY (ROUTINE TESTING W REFLEX): HIV Screen 4th Generation wRfx: NONREACTIVE

## 2019-10-06 MED ORDER — MORPHINE SULFATE (PF) 2 MG/ML IV SOLN
2.0000 mg | INTRAVENOUS | Status: DC | PRN
  Administered 2019-10-06 – 2019-10-09 (×8): 2 mg via INTRAVENOUS
  Filled 2019-10-06 (×8): qty 1

## 2019-10-06 MED ORDER — POTASSIUM CHLORIDE CRYS ER 20 MEQ PO TBCR
40.0000 meq | EXTENDED_RELEASE_TABLET | Freq: Two times a day (BID) | ORAL | Status: AC
  Administered 2019-10-06 (×2): 40 meq via ORAL
  Filled 2019-10-06 (×2): qty 2

## 2019-10-06 MED ORDER — OXYCODONE HCL 5 MG PO TABS
5.0000 mg | ORAL_TABLET | ORAL | Status: DC | PRN
  Administered 2019-10-06 – 2019-10-09 (×10): 10 mg via ORAL
  Filled 2019-10-06 (×10): qty 2

## 2019-10-06 MED ORDER — CYCLOBENZAPRINE HCL 10 MG PO TABS
5.0000 mg | ORAL_TABLET | Freq: Three times a day (TID) | ORAL | Status: DC | PRN
  Administered 2019-10-06 – 2019-10-11 (×6): 5 mg via ORAL
  Filled 2019-10-06 (×7): qty 1

## 2019-10-06 MED ORDER — MORPHINE SULFATE (PF) 2 MG/ML IV SOLN
2.0000 mg | INTRAVENOUS | Status: DC | PRN
  Administered 2019-10-06: 2 mg via INTRAVENOUS
  Filled 2019-10-06: qty 1

## 2019-10-06 MED ORDER — GABAPENTIN 100 MG PO CAPS
100.0000 mg | ORAL_CAPSULE | Freq: Three times a day (TID) | ORAL | Status: DC
  Administered 2019-10-06 – 2019-10-10 (×11): 100 mg via ORAL
  Filled 2019-10-06 (×11): qty 1

## 2019-10-06 MED ORDER — DIPHENHYDRAMINE HCL 25 MG PO CAPS
25.0000 mg | ORAL_CAPSULE | Freq: Four times a day (QID) | ORAL | Status: DC | PRN
  Administered 2019-10-06 – 2019-10-18 (×19): 25 mg via ORAL
  Filled 2019-10-06 (×18): qty 1
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
## 2019-10-06 NOTE — Progress Notes (Signed)
PA, Nehemiah Settle, was consulted about wound care for patient's abrasions/rashes on bilateral arms and abdomen. She ordered to continue with cleansing the areas with soapy water or allowing patient to shower to cleanse. Daily xeroform with abd and tape. All dressings cleansed and new dressings placed by RN, Rayfield Citizen.
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
## 2019-10-06 NOTE — Progress Notes (Signed)
Patient complained of generalized itching. Benadryl was given, and problem resolved.
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
## 2019-10-07 ENCOUNTER — Inpatient Hospital Stay (HOSPITAL_COMMUNITY): Payer: 59

## 2019-10-07 ENCOUNTER — Other Ambulatory Visit: Payer: Self-pay

## 2019-10-07 LAB — CBC
HCT: 32.2 % — ABNORMAL LOW (ref 36.0–46.0)
Hemoglobin: 10.5 g/dL — ABNORMAL LOW (ref 12.0–15.0)
MCH: 31.8 pg (ref 26.0–34.0)
MCHC: 32.6 g/dL (ref 30.0–36.0)
MCV: 97.6 fL (ref 80.0–100.0)
Platelets: 235 10*3/uL (ref 150–400)
RBC: 3.3 MIL/uL — ABNORMAL LOW (ref 3.87–5.11)
RDW: 12.7 % (ref 11.5–15.5)
WBC: 10.5 10*3/uL (ref 4.0–10.5)
nRBC: 0 % (ref 0.0–0.2)

## 2019-10-07 LAB — URINALYSIS, ROUTINE W REFLEX MICROSCOPIC
Bilirubin Urine: NEGATIVE
Glucose, UA: NEGATIVE mg/dL
Ketones, ur: NEGATIVE mg/dL
Leukocytes,Ua: NEGATIVE
Nitrite: NEGATIVE
Protein, ur: NEGATIVE mg/dL
Specific Gravity, Urine: 1.024 (ref 1.005–1.030)
pH: 5 (ref 5.0–8.0)

## 2019-10-07 LAB — BASIC METABOLIC PANEL
Anion gap: 10 (ref 5–15)
BUN: 6 mg/dL (ref 6–20)
CO2: 22 mmol/L (ref 22–32)
Calcium: 8.7 mg/dL — ABNORMAL LOW (ref 8.9–10.3)
Chloride: 105 mmol/L (ref 98–111)
Creatinine, Ser: 0.74 mg/dL (ref 0.44–1.00)
GFR calc Af Amer: >60 mL/min (ref >60)
GFR calc non Af Amer: >60 mL/min (ref >60)
Glucose, Bld: 123 mg/dL — ABNORMAL HIGH (ref 70–99)
Potassium: 3.8 mmol/L (ref 3.5–5.1)
Sodium: 137 mmol/L (ref 135–145)

## 2019-10-07 MED ORDER — ENOXAPARIN SODIUM 40 MG/0.4ML ~~LOC~~ SOLN
40.0000 mg | SUBCUTANEOUS | Status: DC
  Administered 2019-10-07 – 2019-10-08 (×2): 40 mg via SUBCUTANEOUS
  Filled 2019-10-07 (×3): qty 0.4

## 2019-10-07 MED ORDER — ACETAMINOPHEN 325 MG PO TABS
650.0000 mg | ORAL_TABLET | Freq: Four times a day (QID) | ORAL | Status: DC
  Administered 2019-10-07 – 2019-10-09 (×8): 650 mg via ORAL
  Filled 2019-10-07 (×8): qty 2

## 2019-10-07 MED ORDER — FERROUS SULFATE 325 (65 FE) MG PO TABS
325.0000 mg | ORAL_TABLET | Freq: Two times a day (BID) | ORAL | Status: DC
  Administered 2019-10-07 – 2019-10-19 (×23): 325 mg via ORAL
  Filled 2019-10-07 (×25): qty 1

## 2019-10-07 MED ORDER — FOLIC ACID 1 MG PO TABS
1.0000 mg | ORAL_TABLET | Freq: Every day | ORAL | Status: DC
  Administered 2019-10-07 – 2019-10-19 (×12): 1 mg via ORAL
  Filled 2019-10-07 (×13): qty 1

## 2019-10-07 MED ORDER — POLYETHYLENE GLYCOL 3350 17 G PO PACK
17.0000 g | PACK | Freq: Every day | ORAL | Status: DC
  Administered 2019-10-08 – 2019-10-10 (×2): 17 g via ORAL
  Filled 2019-10-07 (×4): qty 1

## 2019-10-07 MED ORDER — THIAMINE HCL 100 MG PO TABS
100.0000 mg | ORAL_TABLET | Freq: Every day | ORAL | Status: DC
  Administered 2019-10-08 – 2019-10-19 (×11): 100 mg via ORAL
  Filled 2019-10-07 (×12): qty 1

## 2019-10-07 MED ORDER — PANTOPRAZOLE SODIUM 40 MG PO TBEC
40.0000 mg | DELAYED_RELEASE_TABLET | Freq: Every day | ORAL | Status: DC
  Administered 2019-10-07 – 2019-10-10 (×4): 40 mg via ORAL
  Filled 2019-10-07 (×4): qty 1

## 2019-10-07 MED ORDER — ASCORBIC ACID 500 MG PO TABS
500.0000 mg | ORAL_TABLET | Freq: Every day | ORAL | Status: DC
  Administered 2019-10-07 – 2019-10-19 (×12): 500 mg via ORAL
  Filled 2019-10-07 (×12): qty 1

## 2019-10-07 MED ORDER — DOCUSATE SODIUM 100 MG PO CAPS
100.0000 mg | ORAL_CAPSULE | Freq: Two times a day (BID) | ORAL | Status: DC
  Administered 2019-10-07 – 2019-10-08 (×4): 100 mg via ORAL
  Filled 2019-10-07 (×4): qty 1
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
## 2019-10-07 NOTE — TOC Progression Note (Signed)
Transition of Care [facility]) - Progression Note    Patient Details  Name: Jacqueline Erickson MRN: [id number] Date of Birth: 1990-09-09  [facility]) CM/SW Contact  Glennon Mac, RN Phone Number: 10/07/2019, 2:39 PM  Clinical Narrative: Referral made to [facility] for charity home health care.  Referral to [facility] for recommended DME.  RW and 3 in 1 to be delivered to pt's room prior to dc.        Expected Discharge Plan: Home w Home Health Services Barriers to Discharge: Continued Medical Work up  Expected Discharge Plan and Services Expected Discharge Plan: Home w Home Health Services   Discharge Planning Services: CM Consult, MATCH Program, Medication Assistance Post Acute Care Choice: Home Health Living arrangements for the past 2 months: Single Family Home                 DME Arranged: 3-N-1, Walker rolling DME Agency: [facility] Date DME Agency Contacted: 10/07/19 Time DME Agency Contacted: 1438 Representative spoke with at DME Agency: Oletha Cruel HH Arranged: PT, OT HH Agency: [facility] Date HH Agency Contacted: 10/07/19 Time HH Agency Contacted: 1000 Representative spoke with at [facility] Agency: Jeryl Columbia   Social Determinants of Health (SDOH) Interventions    Readmission Risk Interventions No flowsheet data found.  Quintella Baton, RN, BSN  Trauma/Neuro ICU Case Manager [phone]

## 2019-10-07 NOTE — Progress Notes (Addendum)
Pt's MEWS score red from yellow. Pt had just gotten done working with PT and was in pain. Tachycardia relieved with PRN metoprolol. MEWS back to yellow. Primary team notified.
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
## 2019-10-07 NOTE — Progress Notes (Signed)
Orthopedic Tech Progress Note Patient Details:  HALIEY ROMBERG 03-30-1991 [id number]  Ortho Devices Type of Ortho Device: CAM walker Ortho Device/Splint Location: Droped off Cam walker in room, Patient in MRI Ortho Device/Splint Interventions: Application, Ordered   Post Interventions Patient Tolerated: Fair Instructions Provided: Care of device, Adjustment of device   Saul Fordyce 10/07/2019, 6:10 PM
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
## 2019-10-08 MED ORDER — CHLORHEXIDINE GLUCONATE 4 % EX LIQD: 60.0000 mL | Freq: Once | CUTANEOUS | Status: DC

## 2019-10-08 MED ORDER — LORAZEPAM 1 MG PO TABS: 1.0000 mg | ORAL_TABLET | ORAL | Status: AC | PRN

## 2019-10-08 MED ORDER — DEXTROSE 5 % IV SOLN
3.0000 g | INTRAVENOUS | Status: AC
  Administered 2019-10-09 (×2): 3 g via INTRAVENOUS
  Filled 2019-10-08: qty 3

## 2019-10-08 MED ORDER — LORAZEPAM 2 MG/ML IJ SOLN
1.0000 mg | INTRAMUSCULAR | Status: AC | PRN
  Administered 2019-10-10 (×2): 1 mg via INTRAVENOUS
  Filled 2019-10-08 (×2): qty 1

## 2019-10-08 MED ORDER — OFLOXACIN 0.3 % OP SOLN
4.0000 [drp] | Freq: Two times a day (BID) | OPHTHALMIC | Status: AC
  Administered 2019-10-08 – 2019-10-12 (×9): 4 [drp] via OTIC
  Filled 2019-10-08 (×2): qty 5
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
## 2019-10-09 ENCOUNTER — Inpatient Hospital Stay (HOSPITAL_COMMUNITY): Payer: 59

## 2019-10-09 ENCOUNTER — Encounter (HOSPITAL_COMMUNITY): Payer: Self-pay

## 2019-10-09 ENCOUNTER — Inpatient Hospital Stay (HOSPITAL_COMMUNITY): Payer: 59 | Admitting: Anesthesiology

## 2019-10-09 ENCOUNTER — Encounter (HOSPITAL_COMMUNITY): Admission: EM | Disposition: A | Discharge status: Home Health | Payer: Self-pay | Source: Home / Self Care

## 2019-10-09 HISTORY — PX: ANTERIOR CRUCIATE LIGAMENT REPAIR: SHX115

## 2019-10-09 HISTORY — PX: MEDIAL COLLATERAL LIGAMENT REPAIR, KNEE: SHX2019

## 2019-10-09 HISTORY — PX: POSTERIOR CRUCIATE LIGAMENT RECONSTRUCTION: SHX749

## 2019-10-09 LAB — CBC
HCT: 30.2 % — ABNORMAL LOW (ref 36.0–46.0)
HCT: 36.2 % (ref 36.0–46.0)
Hemoglobin: 10.1 g/dL — ABNORMAL LOW (ref 12.0–15.0)
Hemoglobin: 11.6 g/dL — ABNORMAL LOW (ref 12.0–15.0)
MCH: 32.4 pg (ref 26.0–34.0)
MCH: 31.6 pg (ref 26.0–34.0)
MCHC: 33.4 g/dL (ref 30.0–36.0)
MCHC: 32 g/dL (ref 30.0–36.0)
MCV: 96.8 fL (ref 80.0–100.0)
MCV: 98.6 fL (ref 80.0–100.0)
Platelets: 286 10*3/uL (ref 150–400)
Platelets: 362 10*3/uL (ref 150–400)
RBC: 3.12 MIL/uL — ABNORMAL LOW (ref 3.87–5.11)
RBC: 3.67 MIL/uL — ABNORMAL LOW (ref 3.87–5.11)
RDW: 12.6 % (ref 11.5–15.5)
RDW: 12.6 % (ref 11.5–15.5)
WBC: 10.9 10*3/uL — ABNORMAL HIGH (ref 4.0–10.5)
WBC: 14.2 10*3/uL — ABNORMAL HIGH (ref 4.0–10.5)
nRBC: 0 % (ref 0.0–0.2)
nRBC: 0 % (ref 0.0–0.2)

## 2019-10-09 LAB — CREATININE, SERUM
Creatinine, Ser: 0.75 mg/dL (ref 0.44–1.00)
GFR calc Af Amer: >60 mL/min (ref >60)
GFR calc non Af Amer: >60 mL/min (ref >60)

## 2019-10-09 LAB — URINE CULTURE: Culture: >=100000 — AB

## 2019-10-09 SURGERY — RECONSTRUCTION, KNEE, ACL
Anesthesia: General | Site: Knee | Laterality: Right

## 2019-10-09 MED ORDER — FENTANYL CITRATE (PF) 100 MCG/2ML IJ SOLN: 50.0000 ug | Freq: Once | INTRAMUSCULAR | Status: AC

## 2019-10-09 MED ORDER — ONDANSETRON HCL 4 MG PO TABS: 4.0000 mg | ORAL_TABLET | Freq: Four times a day (QID) | ORAL | Status: DC | PRN

## 2019-10-09 MED ORDER — ONDANSETRON HCL 4 MG/2ML IJ SOLN
INTRAMUSCULAR | Status: AC
  Filled 2019-10-09: qty 2

## 2019-10-09 MED ORDER — ONDANSETRON HCL 4 MG/2ML IJ SOLN: 4.0000 mg | Freq: Once | INTRAMUSCULAR | Status: DC | PRN

## 2019-10-09 MED ORDER — LACTATED RINGERS IV SOLN: INTRAVENOUS | Status: DC

## 2019-10-09 MED ORDER — ACETAMINOPHEN 325 MG PO TABS: 325.0000 mg | ORAL_TABLET | ORAL | Status: DC | PRN

## 2019-10-09 MED ORDER — BUPIVACAINE LIPOSOME 1.3 % IJ SUSP
INTRAMUSCULAR | Status: DC | PRN
  Administered 2019-10-09: 10 mL via PERINEURAL

## 2019-10-09 MED ORDER — MIDAZOLAM HCL 2 MG/2ML IJ SOLN
INTRAMUSCULAR | Status: DC | PRN
  Administered 2019-10-09: 2 mg via INTRAVENOUS

## 2019-10-09 MED ORDER — DEXAMETHASONE SODIUM PHOSPHATE 10 MG/ML IJ SOLN
INTRAMUSCULAR | Status: AC
  Filled 2019-10-09: qty 1

## 2019-10-09 MED ORDER — HYDROMORPHONE HCL 1 MG/ML IJ SOLN
INTRAMUSCULAR | Status: AC
  Filled 2019-10-09: qty 0.5

## 2019-10-09 MED ORDER — FENTANYL CITRATE (PF) 250 MCG/5ML IJ SOLN
INTRAMUSCULAR | Status: AC
  Filled 2019-10-09: qty 5

## 2019-10-09 MED ORDER — FENTANYL CITRATE (PF) 100 MCG/2ML IJ SOLN
25.0000 ug | INTRAMUSCULAR | Status: DC | PRN
  Administered 2019-10-09 (×3): 50 ug via INTRAVENOUS

## 2019-10-09 MED ORDER — OXYCODONE HCL 5 MG/5ML PO SOLN: 5.0000 mg | Freq: Once | ORAL | Status: DC | PRN

## 2019-10-09 MED ORDER — VANCOMYCIN HCL 1000 MG IV SOLR
INTRAVENOUS | Status: DC | PRN
  Administered 2019-10-09: 1000 mg

## 2019-10-09 MED ORDER — TOBRAMYCIN SULFATE 1.2 G IJ SOLR
INTRAMUSCULAR | Status: AC
  Filled 2019-10-09: qty 1.2

## 2019-10-09 MED ORDER — MIDAZOLAM HCL 2 MG/2ML IJ SOLN
INTRAMUSCULAR | Status: AC
  Filled 2019-10-09: qty 2

## 2019-10-09 MED ORDER — BUPIVACAINE-EPINEPHRINE (PF) 0.5% -1:200000 IJ SOLN
INTRAMUSCULAR | Status: DC | PRN
  Administered 2019-10-09: 20 mL via PERINEURAL

## 2019-10-09 MED ORDER — FENTANYL CITRATE (PF) 100 MCG/2ML IJ SOLN
INTRAMUSCULAR | Status: AC
  Filled 2019-10-09: qty 2

## 2019-10-09 MED ORDER — BUPIVACAINE HCL (PF) 0.25 % IJ SOLN
INTRAMUSCULAR | Status: DC | PRN
  Administered 2019-10-09: 10 mL

## 2019-10-09 MED ORDER — HYDROMORPHONE HCL 1 MG/ML IJ SOLN
0.5000 mg | INTRAMUSCULAR | Status: DC | PRN
  Administered 2019-10-10 – 2019-10-11 (×6): 1 mg via INTRAVENOUS
  Filled 2019-10-09 (×6): qty 1

## 2019-10-09 MED ORDER — PHENYLEPHRINE 40 MCG/ML (10ML) SYRINGE FOR IV PUSH (FOR BLOOD PRESSURE SUPPORT)
PREFILLED_SYRINGE | INTRAVENOUS | Status: DC | PRN
  Administered 2019-10-09: 80 ug via INTRAVENOUS

## 2019-10-09 MED ORDER — DIPHENHYDRAMINE HCL 50 MG/ML IJ SOLN
INTRAMUSCULAR | Status: AC
  Filled 2019-10-09: qty 1

## 2019-10-09 MED ORDER — LACTATED RINGERS IV SOLN: INTRAVENOUS | Status: DC | PRN

## 2019-10-09 MED ORDER — MIDAZOLAM HCL 2 MG/2ML IJ SOLN: 2.0000 mg | Freq: Once | INTRAMUSCULAR | Status: AC

## 2019-10-09 MED ORDER — ONDANSETRON HCL 4 MG/2ML IJ SOLN
INTRAMUSCULAR | Status: DC | PRN
  Administered 2019-10-09: 4 mg via INTRAVENOUS
  Administered 2019-10-09: 2 mg via INTRAVENOUS

## 2019-10-09 MED ORDER — PROPOFOL 10 MG/ML IV BOLUS
INTRAVENOUS | Status: AC
  Filled 2019-10-09: qty 20

## 2019-10-09 MED ORDER — BUPIVACAINE HCL (PF) 0.25 % IJ SOLN
INTRAMUSCULAR | Status: AC
  Filled 2019-10-09: qty 30

## 2019-10-09 MED ORDER — ACETAMINOPHEN 160 MG/5ML PO SOLN: 325.0000 mg | ORAL | Status: DC | PRN

## 2019-10-09 MED ORDER — ENOXAPARIN SODIUM 40 MG/0.4ML ~~LOC~~ SOLN
40.0000 mg | SUBCUTANEOUS | Status: DC
  Administered 2019-10-10: 40 mg via SUBCUTANEOUS
  Filled 2019-10-09 (×2): qty 0.4

## 2019-10-09 MED ORDER — PROPOFOL 10 MG/ML IV BOLUS
INTRAVENOUS | Status: DC | PRN
  Administered 2019-10-09: 170 mg via INTRAVENOUS

## 2019-10-09 MED ORDER — VANCOMYCIN HCL 1000 MG IV SOLR
INTRAVENOUS | Status: AC
  Filled 2019-10-09: qty 1000

## 2019-10-09 MED ORDER — CEFAZOLIN SODIUM-DEXTROSE 1-4 GM/50ML-% IV SOLN
1.0000 g | Freq: Four times a day (QID) | INTRAVENOUS | Status: AC
  Administered 2019-10-09 – 2019-10-10 (×3): 1 g via INTRAVENOUS
  Filled 2019-10-09 (×3): qty 50

## 2019-10-09 MED ORDER — OXYCODONE HCL 5 MG PO TABS
5.0000 mg | ORAL_TABLET | ORAL | Status: DC | PRN
  Administered 2019-10-09 – 2019-10-10 (×5): 10 mg via ORAL
  Filled 2019-10-09 (×6): qty 2

## 2019-10-09 MED ORDER — PROPOFOL 500 MG/50ML IV EMUL
INTRAVENOUS | Status: DC | PRN
  Administered 2019-10-09: 75 ug/kg/min via INTRAVENOUS

## 2019-10-09 MED ORDER — LIDOCAINE 2% (20 MG/ML) 5 ML SYRINGE
INTRAMUSCULAR | Status: DC | PRN
  Administered 2019-10-09: 60 mg via INTRAVENOUS

## 2019-10-09 MED ORDER — FENTANYL CITRATE (PF) 250 MCG/5ML IJ SOLN
INTRAMUSCULAR | Status: DC | PRN
  Administered 2019-10-09 (×2): 50 ug via INTRAVENOUS
  Administered 2019-10-09: 25 ug via INTRAVENOUS
  Administered 2019-10-09 (×2): 50 ug via INTRAVENOUS
  Administered 2019-10-09: 25 ug via INTRAVENOUS

## 2019-10-09 MED ORDER — HYDROMORPHONE HCL 1 MG/ML IJ SOLN
INTRAMUSCULAR | Status: DC | PRN
  Administered 2019-10-09 (×2): .5 mg via INTRAVENOUS

## 2019-10-09 MED ORDER — DEXMEDETOMIDINE HCL 200 MCG/2ML IV SOLN
INTRAVENOUS | Status: DC | PRN
  Administered 2019-10-09: 8 ug via INTRAVENOUS
  Administered 2019-10-09 (×2): 12 ug via INTRAVENOUS

## 2019-10-09 MED ORDER — TOBRAMYCIN SULFATE 1.2 G IJ SOLR
INTRAMUSCULAR | Status: DC | PRN
  Administered 2019-10-09: 1.2 g

## 2019-10-09 MED ORDER — ONDANSETRON HCL 4 MG/2ML IJ SOLN
4.0000 mg | Freq: Four times a day (QID) | INTRAMUSCULAR | Status: DC | PRN
  Administered 2019-10-12: 02:00:00 4 mg via INTRAVENOUS
  Filled 2019-10-09: qty 2

## 2019-10-09 MED ORDER — MEPERIDINE HCL 25 MG/ML IJ SOLN: 6.2500 mg | INTRAMUSCULAR | Status: DC | PRN

## 2019-10-09 MED ORDER — 0.9 % SODIUM CHLORIDE (POUR BTL) OPTIME
TOPICAL | Status: DC | PRN
  Administered 2019-10-09: 13:00:00 1000 mL

## 2019-10-09 MED ORDER — DIPHENHYDRAMINE HCL 50 MG/ML IJ SOLN
INTRAMUSCULAR | Status: DC | PRN
  Administered 2019-10-09: 12.5 mg via INTRAVENOUS

## 2019-10-09 MED ORDER — OXYCODONE HCL 5 MG PO TABS: 5.0000 mg | ORAL_TABLET | Freq: Once | ORAL | Status: DC | PRN

## 2019-10-09 MED ORDER — ACETAMINOPHEN 500 MG PO TABS
1000.0000 mg | ORAL_TABLET | Freq: Three times a day (TID) | ORAL | Status: AC
  Administered 2019-10-09 – 2019-10-10 (×4): 1000 mg via ORAL
  Filled 2019-10-09 (×4): qty 2

## 2019-10-09 MED ORDER — LIDOCAINE 2% (20 MG/ML) 5 ML SYRINGE
INTRAMUSCULAR | Status: AC
  Filled 2019-10-09: qty 5

## 2019-10-09 MED ORDER — DEXAMETHASONE SODIUM PHOSPHATE 10 MG/ML IJ SOLN
INTRAMUSCULAR | Status: DC | PRN
  Administered 2019-10-09: 5 mg via INTRAVENOUS

## 2019-10-09 MED ORDER — DOCUSATE SODIUM 100 MG PO CAPS
100.0000 mg | ORAL_CAPSULE | Freq: Two times a day (BID) | ORAL | Status: DC
  Administered 2019-10-09 – 2019-10-19 (×18): 100 mg via ORAL
  Filled 2019-10-09 (×20): qty 1

## 2019-10-09 MED ORDER — FENTANYL CITRATE (PF) 100 MCG/2ML IJ SOLN
INTRAMUSCULAR | Status: AC
  Administered 2019-10-09: 50 ug via INTRAVENOUS
  Filled 2019-10-09: qty 2

## 2019-10-09 MED ORDER — MIDAZOLAM HCL 2 MG/2ML IJ SOLN
INTRAMUSCULAR | Status: AC
  Administered 2019-10-09: 2 mg via INTRAVENOUS
  Filled 2019-10-09: qty 2

## 2019-10-09 MED ORDER — SODIUM CHLORIDE 0.9 % IR SOLN
Status: DC | PRN
  Administered 2019-10-09 (×6): 3000 mL

## 2019-10-09 SURGICAL SUPPLY — 106 items
ALLOGRAFT GRFTLNK IMPLANT SYST (Anchor) IMPLANT
ANCH SUT SWLK 19.1X5.5 CLS EL (Anchor) ×1 IMPLANT
ANCHOR PEEK SWIVEL LOCK 5.5 (Anchor) ×2 IMPLANT
APL PRP STRL LF DISP 70% ISPRP (MISCELLANEOUS) ×1
BANDAGE ESMARK 6X9 LF (GAUZE/BANDAGES/DRESSINGS) ×1 IMPLANT
BLADE 4.2CUDA (BLADE) IMPLANT
BLADE AVERAGE 25MMX9MM (BLADE) ×1
BLADE AVERAGE 25X9 (BLADE) ×2 IMPLANT
BLADE CUDA 5.5 (BLADE) IMPLANT
BLADE CUDA GRT WHITE 3.5 (BLADE) IMPLANT
BLADE CUTTER GATOR 3.5 (BLADE) ×3 IMPLANT
BLADE CUTTER MENIS 5.5 (BLADE) IMPLANT
BLADE EXCALIBUR 4.0MM X 13CM (MISCELLANEOUS) ×1
BLADE EXCALIBUR 4.0X13 (MISCELLANEOUS) ×2 IMPLANT
BLADE SURG 15 STRL LF DISP TIS (BLADE) ×1 IMPLANT
BLADE SURG 15 STRL SS (BLADE) ×3
BNDG CMPR 9X6 STRL LF SNTH (GAUZE/BANDAGES/DRESSINGS) ×1
BNDG ELASTIC 4X5.8 VLCR STR LF (GAUZE/BANDAGES/DRESSINGS) ×2 IMPLANT
BNDG ELASTIC 6X5.8 VLCR STR LF (GAUZE/BANDAGES/DRESSINGS) ×2 IMPLANT
BNDG ESMARK 6X9 LF (GAUZE/BANDAGES/DRESSINGS) ×3
BNDG GAUZE ELAST 4 BULKY (GAUZE/BANDAGES/DRESSINGS) ×4 IMPLANT
BONE HARVESTER 9MM (NEEDLE) ×2 IMPLANT
BUR EGG 3PK/BX (BURR) ×3 IMPLANT
BUR OVAL 4.0 (BURR) ×3 IMPLANT
CHLORAPREP W/TINT 26 (MISCELLANEOUS) ×3 IMPLANT
CLOSURE STERI-STRIP 1/2X4 (GAUZE/BANDAGES/DRESSINGS) ×1
CLOSURE STERI-STRIP 1/4X4 (GAUZE/BANDAGES/DRESSINGS) ×2 IMPLANT
CLSR STERI-STRIP ANTIMIC 1/2X4 (GAUZE/BANDAGES/DRESSINGS) ×2 IMPLANT
COVER BACK TABLE 60X90IN (DRAPES) ×3 IMPLANT
COVER WAND RF STERILE (DRAPES) ×3 IMPLANT
CUFF TOURN SGL QUICK 34 (TOURNIQUET CUFF)
CUFF TRNQT CYL 34X4.125X (TOURNIQUET CUFF) IMPLANT
CUTTER MENISCUS  4.2MM (BLADE)
CUTTER MENISCUS 4.2MM (BLADE) IMPLANT
DECANTER SPIKE VIAL GLASS SM (MISCELLANEOUS) IMPLANT
DRAPE ARTHROSCOPY W/POUCH 114 (DRAPES) ×3 IMPLANT
DRAPE IMP U-DRAPE 54X76 (DRAPES) ×3 IMPLANT
DRAPE U-SHAPE 47X51 STRL (DRAPES) ×3 IMPLANT
DRSG EMULSION OIL 3X3 NADH (GAUZE/BANDAGES/DRESSINGS) IMPLANT
ELECT REM PT RETURN 9FT ADLT (ELECTROSURGICAL) ×3
ELECTRODE REM PT RTRN 9FT ADLT (ELECTROSURGICAL) ×1 IMPLANT
GAUZE SPONGE 4X4 12PLY STRL (GAUZE/BANDAGES/DRESSINGS) ×4 IMPLANT
GLOVE BIOGEL PI IND STRL 8 (GLOVE) ×1 IMPLANT
GLOVE BIOGEL PI INDICATOR 8 (GLOVE) ×2
GLOVE ECLIPSE 8.0 STRL XLNG CF (GLOVE) ×6 IMPLANT
GOWN STRL REUS W/ TWL LRG LVL3 (GOWN DISPOSABLE) ×3 IMPLANT
GOWN STRL REUS W/ TWL XL LVL3 (GOWN DISPOSABLE) ×2 IMPLANT
GOWN STRL REUS W/TWL LRG LVL3 (GOWN DISPOSABLE) ×9
GOWN STRL REUS W/TWL XL LVL3 (GOWN DISPOSABLE) ×6
GRAFT TISS 65-80 FRZN TENDON (Tissue) IMPLANT
GRAFT TISS SEMITEND 4-8 (Bone Implant) IMPLANT
HARVESTER TENDON QUADPRO 9 (ORTHOPEDIC DISPOSABLE SUPPLIES) ×2 IMPLANT
IMMOBILIZER KNEE 22 UNIV (SOFTGOODS) ×3 IMPLANT
IMMOBILIZER KNEE 24 THIGH 36 (MISCELLANEOUS) ×1 IMPLANT
IMMOBILIZER KNEE 24 UNIV (MISCELLANEOUS) ×3 IMPLANT
IMPL TIGHTROP FIBERTAG ACL (Orthopedic Implant) IMPLANT
IMPLANT TIGHTROPE FIBERTAG ACL (Orthopedic Implant) ×3 IMPLANT
IV NS IRRIG 3000ML ARTHROMATIC (IV SOLUTION) ×12 IMPLANT
KIT BASIN OR (CUSTOM PROCEDURE TRAY) ×3 IMPLANT
KIT TRANSTIBIAL (DISPOSABLE) IMPLANT
KNIFE GRAFT ACL 10MM 5952 (MISCELLANEOUS) IMPLANT
KNIFE GRAFT ACL 9MM (MISCELLANEOUS) IMPLANT
MANIFOLD NEPTUNE II (INSTRUMENTS) ×3 IMPLANT
NS IRRIG 1000ML POUR BTL (IV SOLUTION) ×3 IMPLANT
PACK ARTHROSCOPY DSU (CUSTOM PROCEDURE TRAY) ×3 IMPLANT
PAD ABD 8X10 STRL (GAUZE/BANDAGES/DRESSINGS) ×6 IMPLANT
PAD CAST 4YDX4 CTTN HI CHSV (CAST SUPPLIES) ×1 IMPLANT
PADDING CAST COTTON 4X4 STRL (CAST SUPPLIES) ×3
PADDING CAST COTTON 6X4 STRL (CAST SUPPLIES) ×3 IMPLANT
PASSER SUT SWANSON 36MM LOOP (INSTRUMENTS) IMPLANT
PENCIL BUTTON HOLSTER BLD 10FT (ELECTRODE) IMPLANT
PK GRAFTLINK ALLO IMPLANT SYST (Anchor) ×3 IMPLANT
PROBE BIPOLAR ATHRO 135MM 90D (MISCELLANEOUS) ×3 IMPLANT
SCREW BIOCOMP 7X20 (Screw) ×2 IMPLANT
SCREW FT BIOCOMP 9X30 (Screw) ×2 IMPLANT
SLEEVE SCD COMPRESS KNEE MED (MISCELLANEOUS) IMPLANT
SPONGE LAP 4X18 RFD (DISPOSABLE) IMPLANT
SUCTION FRAZIER HANDLE 10FR (MISCELLANEOUS)
SUCTION TUBE FRAZIER 10FR DISP (MISCELLANEOUS) IMPLANT
SUT ETHILON 2 0 FS 18 (SUTURE) ×3 IMPLANT
SUT ETHILON 3 0 PS 1 (SUTURE) IMPLANT
SUT FIBERWIRE #2 38 T-5 BLUE (SUTURE) ×9
SUT MNCRL AB 4-0 PS2 18 (SUTURE) ×4 IMPLANT
SUT MON AB 2-0 CT1 36 (SUTURE) IMPLANT
SUT PROLENE 3 0 PS 2 (SUTURE) IMPLANT
SUT VIC AB 0 CT1 27 (SUTURE) ×9
SUT VIC AB 0 CT1 27XBRD ANBCTR (SUTURE) IMPLANT
SUT VIC AB 0 SH 27 (SUTURE) ×3 IMPLANT
SUT VIC AB 2-0 SH 27 (SUTURE) ×3
SUT VIC AB 2-0 SH 27XBRD (SUTURE) ×1 IMPLANT
SUT VIC AB 3-0 CT1 27 (SUTURE) ×9
SUT VIC AB 3-0 CT1 TAPERPNT 27 (SUTURE) IMPLANT
SUT VIC AB 3-0 SH 27 (SUTURE)
SUT VIC AB 3-0 SH 27X BRD (SUTURE) IMPLANT
SUT VICRYL 4-0 PS2 18IN ABS (SUTURE) IMPLANT
SUTURE FIBERWR #2 38 T-5 BLUE (SUTURE) ×2 IMPLANT
SYS INTERNAL BRACE KNEE (Miscellaneous) ×3 IMPLANT
SYSTEM INTERNAL BRACE KNEE (Miscellaneous) IMPLANT
TAPE CLOTH 3X10 TAN LF (GAUZE/BANDAGES/DRESSINGS) IMPLANT
TENDON SEMI-TENDINOSUS (Bone Implant) ×3 IMPLANT
TISSUE GRAFTLINK 65-95MML (Tissue) ×3 IMPLANT
TOWEL GREEN STERILE FF (TOWEL DISPOSABLE) ×3 IMPLANT
TOWEL OR NON WOVEN STRL DISP B (DISPOSABLE) ×6 IMPLANT
TUBING ARTHROSCOPY IRRIG 16FT (MISCELLANEOUS) ×3 IMPLANT
WATER STERILE IRR 1000ML POUR (IV SOLUTION) ×3 IMPLANT
WRAP KNEE MAXI GEL POST OP (GAUZE/BANDAGES/DRESSINGS) ×3 IMPLANT

## 2019-10-09 NOTE — Anesthesia Procedure Notes (Addendum)
Anesthesia Regional Block: Femoral nerve block   Pre-Anesthetic Checklist: ,, timeout performed, Correct Patient, Correct Site, Correct Laterality, Correct Procedure, Correct Position, site marked, Risks and benefits discussed,  Surgical consent,  Pre-op evaluation,  At surgeon's request and post-op pain management  Laterality: Right  Prep: chloraprep       Needles:  Injection technique: Single-shot  Needle Type: Echogenic Stimulator Needle     Needle Length: 5cm  Needle Gauge: 22     Additional Needles:   Procedures:, nerve stimulator,,, ultrasound used (permanent image in chart),,,,   Nerve Stimulator or Paresthesia:  Response: quadraceps contraction, 0.45 mA,   Additional Responses:   Narrative:  Start time: 10/09/2019 12:15 PM End time: 10/09/2019 12:20 PM Injection made incrementally with aspirations every 5 mL.  Performed by: Personally  Anesthesiologist: Bethena Midget, MD  Additional Notes: Functioning IV was confirmed and monitors were applied.  A 49mm 22ga Arrow echogenic stimulator needle was used. Sterile prep and drape,hand hygiene and sterile gloves were used. Ultrasound guidance: relevant anatomy identified, needle position confirmed, local anesthetic spread visualized around nerve(s)., vascular puncture avoided.  Image printed for medical record. Negative aspiration and negative test dose prior to incremental administration of local anesthetic. The patient tolerated the procedure well.

## 2019-10-09 NOTE — Transfer of Care (Signed)
Immediate Anesthesia Transfer of Care Note  Patient: Jacqueline Erickson  Procedure(s) Performed: RECONSTRUCTION ANTERIOR CRUCIATE LIGAMENT (ACL) (Right Knee) RECONSTRUCTION POSTERIOR CRUCIATE LIGAMENT (PCL) POSTERIOR LATERAL CORNER RECONSTRUCTION (Right Knee) REPAIR MEDIAL COLLATERAL LIGAMENT (MCL) (Right Knee)  Patient Location: PACU  Anesthesia Type:General  Level of Consciousness: drowsy and patient cooperative  Airway & Oxygen Therapy: Patient Spontanous Breathing and Patient connected to face mask oxygen  Post-op Assessment: Report given to RN and Post -op Vital signs reviewed and stable  Post vital signs: Reviewed and stable  Last Vitals:  Vitals Value Taken Time  BP 125/81 10/09/19 1703  Temp    Pulse 102 10/09/19 1702  Resp 27 10/09/19 1703  SpO2 100 % 10/09/19 1702  Vitals shown include unvalidated device data.  Last Pain:  Vitals:   10/09/19 1215  TempSrc:   PainSc: 0-No pain      Patients Stated Pain Goal: 3 (10/07/19 1325)  Complications: No apparent anesthesia complications
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
## 2019-10-09 NOTE — Interval H&P Note (Signed)
Talk to patient again. All questions answered.

## 2019-10-09 NOTE — Anesthesia Postprocedure Evaluation (Signed)
Anesthesia Post Note  Patient: Jacqueline Erickson  Procedure(s) Performed: RECONSTRUCTION ANTERIOR CRUCIATE LIGAMENT (ACL) (Right Knee) RECONSTRUCTION POSTERIOR CRUCIATE LIGAMENT (PCL) POSTERIOR LATERAL CORNER RECONSTRUCTION (Right Knee) REPAIR MEDIAL COLLATERAL LIGAMENT (MCL) (Right Knee)     Patient location during evaluation: PACU Anesthesia Type: General and Regional Level of consciousness: awake and alert Pain management: pain level controlled Vital Signs Assessment: post-procedure vital signs reviewed and stable Respiratory status: spontaneous breathing, nonlabored ventilation, respiratory function stable and patient connected to nasal cannula oxygen Cardiovascular status: blood pressure returned to baseline and stable Postop Assessment: no apparent nausea or vomiting Anesthetic complications: no    Last Vitals:  Vitals:   10/09/19 1803 10/09/19 1821  BP: 122/66 134/89  Pulse: 85 84  Resp: 20   Temp:  36.9 C  SpO2: 100% 100%    Last Pain:  Vitals:   10/09/19 1821  TempSrc: Oral  PainSc:                  [name]

## 2019-10-09 NOTE — Anesthesia Preprocedure Evaluation (Signed)
Anesthesia Evaluation  Patient identified by MRN, date of birth, ID band Patient awake    Reviewed: Allergy & Precautions, H&P , NPO status , Patient's Chart, lab work & pertinent test results, reviewed documented beta blocker date and time   Airway Mallampati: II  TM Distance: >3 FB Neck ROM: full    Dental no notable dental hx.    Pulmonary neg pulmonary ROS,    Pulmonary exam normal breath sounds clear to auscultation       Cardiovascular Exercise Tolerance: Good negative cardio ROS   Rhythm:regular Rate:Normal     Neuro/Psych negative neurological ROS  negative psych ROS   GI/Hepatic negative GI ROS, Neg liver ROS,   Endo/Other  negative endocrine ROS  Renal/GU negative Renal ROS  negative genitourinary   Musculoskeletal   Abdominal   Peds  Hematology negative hematology ROS (+)   Anesthesia Other Findings   Reproductive/Obstetrics negative OB ROS                             Anesthesia Physical Anesthesia Plan  ASA: II  Anesthesia Plan: General   Post-op Pain Management: GA combined w/ Regional for post-op pain   Induction:   PONV Risk Score and Plan: 3 and Ondansetron, Dexamethasone, Treatment may vary due to age or medical condition and Midazolam  Airway Management Planned: Oral ETT and LMA  Additional Equipment:   Intra-op Plan:   Post-operative Plan: Extubation in OR  Informed Consent: I have reviewed the patients History and Physical, chart, labs and discussed the procedure including the risks, benefits and alternatives for the proposed anesthesia with the patient or authorized representative who has indicated his/her understanding and acceptance.     Dental Advisory Given  Plan Discussed with: CRNA and Anesthesiologist  Anesthesia Plan Comments: (Discussed both nerve block for pain relief post-op and GA; including NV, sore throat, dental injury, and pulmonary  complications)        Anesthesia Quick Evaluation

## 2019-10-09 NOTE — Progress Notes (Signed)
Patient taken off 4N unit at 1130 for surgery. Returned at [facility].
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
## 2019-10-09 NOTE — Anesthesia Procedure Notes (Signed)
Procedure Name: LMA Insertion Date/Time: 10/09/2019 12:57 PM Performed by: Modena Morrow, CRNA Pre-anesthesia Checklist: Patient identified, Emergency Drugs available, Suction available and Patient being monitored Patient Re-evaluated:Patient Re-evaluated prior to induction Oxygen Delivery Method: Circle system utilized Preoxygenation: Pre-oxygenation with 100% oxygen Induction Type: IV induction Ventilation: Mask ventilation without difficulty LMA: LMA inserted LMA Size: 4.0 Tube secured with: Tape Dental Injury: Teeth and Oropharynx as per pre-operative assessment
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
## 2019-10-10 LAB — CBC
HCT: 28 % — ABNORMAL LOW (ref 36.0–46.0)
Hemoglobin: 9.3 g/dL — ABNORMAL LOW (ref 12.0–15.0)
MCH: 31.8 pg (ref 26.0–34.0)
MCHC: 33.2 g/dL (ref 30.0–36.0)
MCV: 95.9 fL (ref 80.0–100.0)
Platelets: 306 10*3/uL (ref 150–400)
RBC: 2.92 MIL/uL — ABNORMAL LOW (ref 3.87–5.11)
RDW: 12.9 % (ref 11.5–15.5)
WBC: 13.2 10*3/uL — ABNORMAL HIGH (ref 4.0–10.5)
nRBC: 0 % (ref 0.0–0.2)

## 2019-10-10 MED ORDER — GABAPENTIN 100 MG PO CAPS
200.0000 mg | ORAL_CAPSULE | Freq: Three times a day (TID) | ORAL | Status: DC
  Administered 2019-10-10 (×2): 200 mg via ORAL
  Filled 2019-10-10 (×3): qty 2

## 2019-10-10 MED ORDER — TRAMADOL HCL 50 MG PO TABS
50.0000 mg | ORAL_TABLET | Freq: Four times a day (QID) | ORAL | Status: DC
  Administered 2019-10-10 – 2019-10-14 (×15): 50 mg via ORAL
  Filled 2019-10-10 (×17): qty 1

## 2019-10-10 NOTE — Progress Notes (Signed)
   ORTHOPAEDIC PROGRESS NOTE  s/p Procedure(s): RECONSTRUCTION ANTERIOR CRUCIATE LIGAMENT (ACL) RECONSTRUCTION POSTERIOR CRUCIATE LIGAMENT (PCL) POSTERIOR LATERAL CORNER RECONSTRUCTION REPAIR MEDIAL COLLATERAL LIGAMENT (MCL) On 10/09/19 by Dr. Everardo Pacific  SUBJECTIVE: Patient was resting in her hospital bed. She reports pain and muscle spasms in her right leg. She had difficulty sleeping in her brace with her knee locked in extension. She denies any chest pain, SOB, or nausea/vomiting. She wants to go to short-term rehab center after discharge because she lives on the 3rd floor of an apartment building with only stairs.   OBJECTIVE: PE: General: patient laying in hospital bed, alert & oriented, no acute distress Right lower extremity: Dressing CDI, Bledsoe brace locked in extension, intact EHL/TA/GSC, sensation intact distally, warm well perfused foot   Vitals:   10/10/19 0317 10/10/19 0828  BP: 135/87   Pulse: (!) 104   Resp: 16   Temp: 99.3 F (37.4 C) 99.4 F (37.4 C)  SpO2: 100%     ASSESSMENT: Jacqueline Erickson is a 29 y.o. female POD#1.  Stable post-operative images.   PLAN: Weightbearing: Touch-down weight bearing RLE. Knee brace locked in extension at all times.  Insicional and dressing care: PRN.  Orthopedic device(s): Bledsoe right knee brace. Removable right ankle brace.  Showering: Post-op day #3 with assistance.  VTE prophylaxis: Lovenox inpatient. Likely will transition to Xarelto outpatient.  Pain control: PRN medications. Minimize narcotics as able.  Follow - up plan: 1 week in office with Dr. Everardo Pacific for incision check and X-ray.  Dispo: Pending further evaluations from PT/OT. Patient thinks she may want to go to short-term rehab as she lives on the 3rd floor of an apartment building.   Alfonse Alpers, PA-C 10/10/2019
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
## 2019-10-11 LAB — CBC
HCT: 28.9 % — ABNORMAL LOW (ref 36.0–46.0)
Hemoglobin: 9.7 g/dL — ABNORMAL LOW (ref 12.0–15.0)
MCH: 32.1 pg (ref 26.0–34.0)
MCHC: 33.6 g/dL (ref 30.0–36.0)
MCV: 95.7 fL (ref 80.0–100.0)
Platelets: 340 10*3/uL (ref 150–400)
RBC: 3.02 MIL/uL — ABNORMAL LOW (ref 3.87–5.11)
RDW: 13 % (ref 11.5–15.5)
WBC: 15.3 10*3/uL — ABNORMAL HIGH (ref 4.0–10.5)
nRBC: 0 % (ref 0.0–0.2)

## 2019-10-11 MED ORDER — BISACODYL 5 MG PO TBEC: 5.0000 mg | DELAYED_RELEASE_TABLET | Freq: Every day | ORAL | Status: DC | PRN

## 2019-10-11 MED ORDER — POLYETHYLENE GLYCOL 3350 17 G PO PACK
17.0000 g | PACK | Freq: Two times a day (BID) | ORAL | Status: DC
  Administered 2019-10-11 – 2019-10-19 (×8): 17 g via ORAL
  Filled 2019-10-11 (×12): qty 1

## 2019-10-11 MED ORDER — FLEET ENEMA 7-19 GM/118ML RE ENEM: 1.0000 | ENEMA | Freq: Once | RECTAL | Status: DC | PRN

## 2019-10-11 MED ORDER — MAGNESIUM CITRATE PO SOLN
1.0000 | Freq: Once | ORAL | Status: AC
  Administered 2019-10-11: 09:00:00 1 via ORAL
  Filled 2019-10-11: qty 296

## 2019-10-11 MED ORDER — OXYCODONE HCL 5 MG PO TABS
10.0000 mg | ORAL_TABLET | ORAL | Status: DC | PRN
  Administered 2019-10-11 – 2019-10-15 (×16): 15 mg via ORAL
  Administered 2019-10-15: 09:00:00 10 mg via ORAL
  Administered 2019-10-15 – 2019-10-19 (×14): 15 mg via ORAL
  Filled 2019-10-11 (×9): qty 3
  Filled 2019-10-11: qty 2
  Filled 2019-10-11 (×12): qty 3
  Filled 2019-10-11: qty 2
  Filled 2019-10-11 (×3): qty 3
  Filled 2019-10-11: qty 2
  Filled 2019-10-11 (×7): qty 3

## 2019-10-11 MED ORDER — GABAPENTIN 300 MG PO CAPS
300.0000 mg | ORAL_CAPSULE | Freq: Three times a day (TID) | ORAL | Status: DC
  Administered 2019-10-11 – 2019-10-12 (×6): 300 mg via ORAL
  Filled 2019-10-11 (×7): qty 1

## 2019-10-11 MED ORDER — CYCLOBENZAPRINE HCL 10 MG PO TABS
10.0000 mg | ORAL_TABLET | Freq: Three times a day (TID) | ORAL | Status: DC
  Administered 2019-10-11 – 2019-10-19 (×25): 10 mg via ORAL
  Filled 2019-10-11 (×25): qty 1

## 2019-10-11 MED ORDER — HYDROMORPHONE HCL 1 MG/ML IJ SOLN
1.0000 mg | Freq: Four times a day (QID) | INTRAMUSCULAR | Status: DC | PRN
  Administered 2019-10-12 – 2019-10-13 (×4): 1 mg via INTRAVENOUS
  Filled 2019-10-11 (×5): qty 1

## 2019-10-11 MED ORDER — ENOXAPARIN SODIUM 40 MG/0.4ML ~~LOC~~ SOLN
40.0000 mg | Freq: Two times a day (BID) | SUBCUTANEOUS | Status: DC
  Administered 2019-10-11 – 2019-10-19 (×16): 40 mg via SUBCUTANEOUS
  Filled 2019-10-11 (×17): qty 0.4

## 2019-10-11 NOTE — Progress Notes (Signed)
Orthopedic Tech Progress Note Patient Details:  Jacqueline Erickson 1991/07/10 [id number] RN called needing ROM KNEE BRACE adjusted. So I called [facility] making a STAT requested. So patient could work with therapy. Patient ID: Jacqueline Erickson, female   DOB: 11-15-1990, 29 y.o.   MRN: [id number]   [name] 10/11/2019, 10:04 AM

## 2019-10-11 NOTE — Discharge Instructions (Signed)
Ramond Marrow MD, MPH Alfonse Alpers, PA-C [facility] [address] [phone] (tel)   [phone] (fax)   POST-OPERATIVE INSTRUCTIONS   WOUND CARE - Leave steri-strips in place until they fall off on their own, usually 2 weeks postop. - An ACE wrap may be used to control swelling, do not wrap this too tight.  If the initial ACE wrap feels too tight you may loosen it. - You may change/reinforce the bandage as needed.  - Use the Cryocuff or Ice as often as possible for the first 7 days, then as needed for pain relief. Always keep a towel, ACE wrap or other barrier between the cooling unit and your skin.  - You may shower on Post-Op Day #3. Gently pat the area dry. Do not soak the knee in water or submerge it.  -  Do not go swimming in the pool or ocean until 4 weeks after surgery or when otherwise instructed.  Keep dry incisions as dry as possible.   BRACE/AMBULATION -  You will be placed in a brace post-operatively. Wear your brace at all times until follow-up. You may remove for hygiene. -   It should be locked in full extension (0 degrees) -            Use crutches to help you ambulate -            Touch-down weight bearing: when you stand or walk, you may only touch your foot to the floor for balance -            Do NOT put any body weight on your leg  FOLLOW-UP   Please call the office to schedule a follow-up appointment for your incision check, 7-10 days post-operatively.  IF YOU HAVE ANY QUESTIONS, PLEASE FEEL FREE TO CALL OUR OFFICE.   HELPFUL INFORMATION   Keep your leg elevated to decrease swelling, which will then in turn decrease your pain. I would elevate the foot of your bed by putting a couple of couch pillows between your mattress and box spring. I would not keep pillow directly under your ankle.  - Do not sleep with a pillow behind your knee even if it is more comfortable as this may make it harder to get your knee fully  straight long term.   There will be MORE swelling on days 1-3 than there is on the day of surgery.  This also is normal. The swelling will decrease with the anti-inflammatory medication, ice and keeping it elevated. The swelling will make it more difficult to bend your knee. As the swelling goes down your motion will become easier   You may develop swelling and bruising that extends from your knee down to your calf and perhaps even to your foot over the next week. Do not be alarmed. This too is normal, and it is due to gravity   There may be some numbness adjacent to the incision site. This may last for 6-12 months or longer in some patients and is expected.   You may return to sedentary work/school in the next couple of days when you feel up to it. You will need to keep your leg elevated as much as possible    You should wean off your narcotic medicines as soon as you are able.  Most patients will be off or using minimal narcotics before their first postop appointment.    We suggest you use the pain medication the first night prior to  going to bed, in order to ease any pain when the anesthesia wears off. You should avoid taking pain medications on an empty stomach as it will make you nauseous.   Do not drink alcoholic beverages or take illicit drugs when taking pain medications.   It is against the law to drive while taking narcotics. You cannot drive if your Right leg is in brace locked in extension.   Pain medication may make you constipated.  Below are a few solutions to try in this order:  o Decrease the amount of pain medication if you aren't having pain.  o Drink lots of decaffeinated fluids.  o Drink prune juice and/or each dried prunes   o If the first 3 don't work start with additional solutions  o Take Colace - an over-the-counter stool softener  o Take Senokot - an over-the-counter laxative  o Take Miralax - a stronger over-the-counter laxative

## 2019-10-11 NOTE — Progress Notes (Signed)
   ORTHOPAEDIC PROGRESS NOTE  s/p Procedure(s): RECONSTRUCTION ANTERIOR CRUCIATE LIGAMENT (ACL) RECONSTRUCTION POSTERIOR CRUCIATE LIGAMENT (PCL) POSTERIOR LATERAL CORNER RECONSTRUCTION REPAIR MEDIAL COLLATERAL LIGAMENT (MCL) On 10/09/19 by Dr. Everardo Pacific  SUBJECTIVE: Patient was resting in her hospital bed. She notes she had her knee brace adjusted and now her right leg feels better. Per nursing staff, she has been complaining of 9/10 pain in her right leg and only reports improvement of pain with IV pain medications. Her IV pain medication has been reduced this morning. She has not had a bowel movement since admission.   OBJECTIVE: PE: General: patient laying in hospital bed, drowsy secondary to recent dose of pain medication, no acute distress Right lower extremity: Dressing was removed, incisions CDI, steri-strips in place, Bledsoe brace locked in extension, intact EHL/TA/GSC, sensation intact distally - she does report paresthesias about the anterior medial shin, but is able to endorse sensation, warm well perfused foot, compartments are soft and compressible.    Vitals:   10/10/19 1925 10/11/19 0820  BP: (!) 147/97 (!) 138/94  Pulse: (!) 113 (!) 118  Resp: 20 20  Temp: 98.8 F (37.1 C) 98.5 F (36.9 C)  SpO2: 94% 99%    ASSESSMENT: Jacqueline Erickson is a 29 y.o. female POD#2.  Stable post-operative images.   PLAN: Weightbearing: Touch-down weight bearing RLE. Knee brace locked in extension at all times.  Insicional and dressing care: Dressing was removed today. Incision can be left open to air - reinforce as needed. Leave steri-strips in place.  Orthopedic device(s): Bledsoe right knee brace. Removable right ankle brace.  Showering: Post-op day #3 with assistance.  VTE prophylaxis: Lovenox inpatient. Recommend transition to Xarelto or Lovenox outpatient.  Pain control: PRN medications. Minimize narcotics as able.  Follow - up plan: 1 week in office with Dr. Everardo Pacific for  incision check and X-ray.  Dispo: Pending further evaluations from PT/OT. Patient wants to go to short-term rehab as she lives on the 3rd floor of an apartment building.   Once ambulating well and cleared by trauma team and therapies, patient is cleared for discharge from orthopedics standpoint.    Alfonse Alpers, PA-C 10/11/2019
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
## 2019-10-12 LAB — CBC
HCT: 28.2 % — ABNORMAL LOW (ref 36.0–46.0)
Hemoglobin: 9.4 g/dL — ABNORMAL LOW (ref 12.0–15.0)
MCH: 31.9 pg (ref 26.0–34.0)
MCHC: 33.3 g/dL (ref 30.0–36.0)
MCV: 95.6 fL (ref 80.0–100.0)
Platelets: 371 10*3/uL (ref 150–400)
RBC: 2.95 MIL/uL — ABNORMAL LOW (ref 3.87–5.11)
RDW: 13 % (ref 11.5–15.5)
WBC: 15.3 10*3/uL — ABNORMAL HIGH (ref 4.0–10.5)
nRBC: 0 % (ref 0.0–0.2)

## 2019-10-12 LAB — URINE CULTURE: Culture: NO GROWTH

## 2019-10-12 LAB — BASIC METABOLIC PANEL
Anion gap: 11 (ref 5–15)
BUN: 8 mg/dL (ref 6–20)
CO2: 26 mmol/L (ref 22–32)
Calcium: 8.6 mg/dL — ABNORMAL LOW (ref 8.9–10.3)
Chloride: 97 mmol/L — ABNORMAL LOW (ref 98–111)
Creatinine, Ser: 0.77 mg/dL (ref 0.44–1.00)
GFR calc Af Amer: >60 mL/min (ref >60)
GFR calc non Af Amer: >60 mL/min (ref >60)
Glucose, Bld: 124 mg/dL — ABNORMAL HIGH (ref 70–99)
Potassium: 3.9 mmol/L (ref 3.5–5.1)
Sodium: 134 mmol/L — ABNORMAL LOW (ref 135–145)

## 2019-10-12 LAB — PHOSPHORUS: Phosphorus: 4.3 mg/dL (ref 2.5–4.6)

## 2019-10-12 LAB — MAGNESIUM: Magnesium: 2.3 mg/dL (ref 1.7–2.4)

## 2019-10-12 MED ORDER — LIP MEDEX EX OINT
TOPICAL_OINTMENT | CUTANEOUS | Status: DC | PRN
  Filled 2019-10-12: qty 7
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
## 2019-10-12 NOTE — Progress Notes (Signed)
Patient said she wanted to go to rehab for a while.  She said that she was told CIR only would last a week so now she doesn't want it but would rather get home health.
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
## 2019-10-12 NOTE — Progress Notes (Signed)
Rehab Admissions Coordinator Note:  Per TOC request, patient was screened by Stephania Fragmin for appropriateness for an Inpatient Acute Rehab Consult.  At this time, we are recommending Inpatient Rehab consult.  Will place a consult order per protocol.   Stephania Fragmin 10/12/2019, 3:02 PM  I can be reached at [phone].

## 2019-10-13 ENCOUNTER — Inpatient Hospital Stay (HOSPITAL_COMMUNITY): Payer: 59

## 2019-10-13 ENCOUNTER — Encounter (HOSPITAL_COMMUNITY): Payer: Self-pay

## 2019-10-13 LAB — CBC
HCT: 28.4 % — ABNORMAL LOW (ref 36.0–46.0)
Hemoglobin: 9.5 g/dL — ABNORMAL LOW (ref 12.0–15.0)
MCH: 32.4 pg (ref 26.0–34.0)
MCHC: 33.5 g/dL (ref 30.0–36.0)
MCV: 96.9 fL (ref 80.0–100.0)
Platelets: 430 10*3/uL — ABNORMAL HIGH (ref 150–400)
RBC: 2.93 MIL/uL — ABNORMAL LOW (ref 3.87–5.11)
RDW: 12.9 % (ref 11.5–15.5)
WBC: 17.4 10*3/uL — ABNORMAL HIGH (ref 4.0–10.5)
nRBC: 0 % (ref 0.0–0.2)

## 2019-10-13 LAB — BASIC METABOLIC PANEL
Anion gap: 10 (ref 5–15)
BUN: 10 mg/dL (ref 6–20)
CO2: 24 mmol/L (ref 22–32)
Calcium: 8.5 mg/dL — ABNORMAL LOW (ref 8.9–10.3)
Chloride: 99 mmol/L (ref 98–111)
Creatinine, Ser: 0.79 mg/dL (ref 0.44–1.00)
GFR calc Af Amer: >60 mL/min (ref >60)
GFR calc non Af Amer: >60 mL/min (ref >60)
Glucose, Bld: 118 mg/dL — ABNORMAL HIGH (ref 70–99)
Potassium: 4.4 mmol/L (ref 3.5–5.1)
Sodium: 133 mmol/L — ABNORMAL LOW (ref 135–145)

## 2019-10-13 LAB — MAGNESIUM: Magnesium: 2.3 mg/dL (ref 1.7–2.4)

## 2019-10-13 LAB — PHOSPHORUS: Phosphorus: 4.5 mg/dL (ref 2.5–4.6)

## 2019-10-13 MED ORDER — OFLOXACIN 0.3 % OP SOLN
4.0000 [drp] | Freq: Two times a day (BID) | OPHTHALMIC | Status: DC
  Administered 2019-10-13 – 2019-10-17 (×10): 4 [drp] via OTIC
  Filled 2019-10-13: qty 5

## 2019-10-13 MED ORDER — GABAPENTIN 400 MG PO CAPS
400.0000 mg | ORAL_CAPSULE | Freq: Three times a day (TID) | ORAL | Status: DC
  Administered 2019-10-13 – 2019-10-17 (×13): 400 mg via ORAL
  Filled 2019-10-13 (×13): qty 1

## 2019-10-13 MED ORDER — TRAZODONE HCL 50 MG PO TABS
50.0000 mg | ORAL_TABLET | Freq: Every evening | ORAL | Status: DC | PRN
  Administered 2019-10-13 – 2019-10-15 (×3): 50 mg via ORAL
  Filled 2019-10-13 (×3): qty 1
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
## 2019-10-13 NOTE — Progress Notes (Signed)
Inpatient Rehab Admissions:  Inpatient Rehab Consult received.  I met with patient at the bedside for rehabilitation assessment and to discuss goals and expectations of an inpatient rehab admission.  She is hopeful for CIR admission.  Will need insurance authorization which I will begin. She also mentions she still feels reliant on IV pain control but is working on transitioning to tolerance.   Signed: Shann Medal, PT, DPT Admissions Coordinator [phone] 10/13/19  12:22 PM

## 2019-10-14 ENCOUNTER — Encounter: Payer: Self-pay | Admitting: *Deleted

## 2019-10-14 ENCOUNTER — Inpatient Hospital Stay (HOSPITAL_COMMUNITY): Payer: 59

## 2019-10-14 DIAGNOSIS — M7989 Other specified soft tissue disorders: Secondary | ICD-10-CM

## 2019-10-14 DIAGNOSIS — R52 Pain, unspecified: Secondary | ICD-10-CM

## 2019-10-14 LAB — MAGNESIUM: Magnesium: 2.3 mg/dL (ref 1.7–2.4)

## 2019-10-14 LAB — CBC
HCT: 25.8 % — ABNORMAL LOW (ref 36.0–46.0)
Hemoglobin: 8.6 g/dL — ABNORMAL LOW (ref 12.0–15.0)
MCH: 31.7 pg (ref 26.0–34.0)
MCHC: 33.3 g/dL (ref 30.0–36.0)
MCV: 95.2 fL (ref 80.0–100.0)
Platelets: 441 10*3/uL — ABNORMAL HIGH (ref 150–400)
RBC: 2.71 MIL/uL — ABNORMAL LOW (ref 3.87–5.11)
RDW: 12.8 % (ref 11.5–15.5)
WBC: 14 10*3/uL — ABNORMAL HIGH (ref 4.0–10.5)
nRBC: 0 % (ref 0.0–0.2)

## 2019-10-14 LAB — BASIC METABOLIC PANEL
Anion gap: 10 (ref 5–15)
BUN: 8 mg/dL (ref 6–20)
CO2: 25 mmol/L (ref 22–32)
Calcium: 8.5 mg/dL — ABNORMAL LOW (ref 8.9–10.3)
Chloride: 98 mmol/L (ref 98–111)
Creatinine, Ser: 0.68 mg/dL (ref 0.44–1.00)
GFR calc Af Amer: >60 mL/min (ref >60)
GFR calc non Af Amer: >60 mL/min (ref >60)
Glucose, Bld: 116 mg/dL — ABNORMAL HIGH (ref 70–99)
Potassium: 4 mmol/L (ref 3.5–5.1)
Sodium: 133 mmol/L — ABNORMAL LOW (ref 135–145)

## 2019-10-14 LAB — PHOSPHORUS: Phosphorus: 4.2 mg/dL (ref 2.5–4.6)

## 2019-10-14 MED ORDER — TRAMADOL HCL 50 MG PO TABS
100.0000 mg | ORAL_TABLET | Freq: Three times a day (TID) | ORAL | Status: DC
  Administered 2019-10-14 – 2019-10-19 (×15): 100 mg via ORAL
  Filled 2019-10-14 (×15): qty 2

## 2019-10-14 MED ORDER — HYDROMORPHONE HCL 1 MG/ML IJ SOLN: 1.0000 mg | Freq: Three times a day (TID) | INTRAMUSCULAR | Status: DC | PRN

## 2019-10-14 MED ORDER — ACETAMINOPHEN 325 MG PO TABS
650.0000 mg | ORAL_TABLET | Freq: Four times a day (QID) | ORAL | Status: DC
  Administered 2019-10-14 – 2019-10-15 (×4): 650 mg via ORAL
  Filled 2019-10-14 (×4): qty 2

## 2019-10-14 MED ORDER — SODIUM CHLORIDE 0.9 % IV SOLN: INTRAVENOUS | Status: AC

## 2019-10-14 MED ORDER — HYDROMORPHONE HCL 1 MG/ML IJ SOLN
0.5000 mg | Freq: Three times a day (TID) | INTRAMUSCULAR | Status: DC | PRN
  Administered 2019-10-15: 0.5 mg via INTRAVENOUS
  Filled 2019-10-14: qty 1

## 2019-10-14 MED ORDER — ENSURE ENLIVE PO LIQD: 237.0000 mL | Freq: Two times a day (BID) | ORAL | Status: DC
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
## 2019-10-14 NOTE — Progress Notes (Signed)
Right lower ext venous duplex  has been completed. Refer to [facility] under chart review to view preliminary results.   10/14/2019  11:09 AM Lya Holben, Gerarda Gunther
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
## 2019-10-15 ENCOUNTER — Inpatient Hospital Stay (HOSPITAL_COMMUNITY): Payer: 59

## 2019-10-15 LAB — PHOSPHORUS: Phosphorus: 4.2 mg/dL (ref 2.5–4.6)

## 2019-10-15 LAB — BASIC METABOLIC PANEL
Anion gap: 8 (ref 5–15)
BUN: 8 mg/dL (ref 6–20)
CO2: 26 mmol/L (ref 22–32)
Calcium: 8.8 mg/dL — ABNORMAL LOW (ref 8.9–10.3)
Chloride: 103 mmol/L (ref 98–111)
Creatinine, Ser: 0.6 mg/dL (ref 0.44–1.00)
GFR calc Af Amer: >60 mL/min (ref >60)
GFR calc non Af Amer: >60 mL/min (ref >60)
Glucose, Bld: 109 mg/dL — ABNORMAL HIGH (ref 70–99)
Potassium: 4 mmol/L (ref 3.5–5.1)
Sodium: 137 mmol/L (ref 135–145)

## 2019-10-15 LAB — CBC
HCT: 28.5 % — ABNORMAL LOW (ref 36.0–46.0)
Hemoglobin: 9.2 g/dL — ABNORMAL LOW (ref 12.0–15.0)
MCH: 31.8 pg (ref 26.0–34.0)
MCHC: 32.3 g/dL (ref 30.0–36.0)
MCV: 98.6 fL (ref 80.0–100.0)
Platelets: 486 10*3/uL — ABNORMAL HIGH (ref 150–400)
RBC: 2.89 MIL/uL — ABNORMAL LOW (ref 3.87–5.11)
RDW: 13 % (ref 11.5–15.5)
WBC: 12.6 10*3/uL — ABNORMAL HIGH (ref 4.0–10.5)
nRBC: 0.2 % (ref 0.0–0.2)

## 2019-10-15 LAB — MAGNESIUM: Magnesium: 2.1 mg/dL (ref 1.7–2.4)

## 2019-10-15 MED ORDER — ACETAMINOPHEN 500 MG PO TABS
1000.0000 mg | ORAL_TABLET | Freq: Four times a day (QID) | ORAL | Status: DC
  Administered 2019-10-15 – 2019-10-19 (×14): 1000 mg via ORAL
  Filled 2019-10-15 (×17): qty 2

## 2019-10-15 NOTE — Progress Notes (Signed)
Inpatient Rehab Admissions Coordinator:   Awaiting insurance authorization.   Estill Dooms, PT, DPT Admissions Coordinator [phone] 10/15/19  1:20 PM
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
## 2019-10-15 NOTE — Plan of Care (Signed)
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
## 2019-10-16 LAB — BASIC METABOLIC PANEL
Anion gap: 8 (ref 5–15)
BUN: 7 mg/dL (ref 6–20)
CO2: 25 mmol/L (ref 22–32)
Calcium: 8.7 mg/dL — ABNORMAL LOW (ref 8.9–10.3)
Chloride: 104 mmol/L (ref 98–111)
Creatinine, Ser: 0.64 mg/dL (ref 0.44–1.00)
GFR calc Af Amer: >60 mL/min (ref >60)
GFR calc non Af Amer: >60 mL/min (ref >60)
Glucose, Bld: 102 mg/dL — ABNORMAL HIGH (ref 70–99)
Potassium: 4.1 mmol/L (ref 3.5–5.1)
Sodium: 137 mmol/L (ref 135–145)

## 2019-10-16 LAB — CBC
HCT: 28.6 % — ABNORMAL LOW (ref 36.0–46.0)
Hemoglobin: 9.3 g/dL — ABNORMAL LOW (ref 12.0–15.0)
MCH: 31.6 pg (ref 26.0–34.0)
MCHC: 32.5 g/dL (ref 30.0–36.0)
MCV: 97.3 fL (ref 80.0–100.0)
Platelets: 506 10*3/uL — ABNORMAL HIGH (ref 150–400)
RBC: 2.94 MIL/uL — ABNORMAL LOW (ref 3.87–5.11)
RDW: 13.2 % (ref 11.5–15.5)
WBC: 13.6 10*3/uL — ABNORMAL HIGH (ref 4.0–10.5)
nRBC: 0.1 % (ref 0.0–0.2)

## 2019-10-16 LAB — PHOSPHORUS: Phosphorus: 4.3 mg/dL (ref 2.5–4.6)

## 2019-10-16 LAB — MAGNESIUM: Magnesium: 2.1 mg/dL (ref 1.7–2.4)

## 2019-10-16 MED ORDER — FUROSEMIDE 40 MG PO TABS
40.0000 mg | ORAL_TABLET | Freq: Once | ORAL | Status: AC
  Administered 2019-10-16: 10:00:00 40 mg via ORAL
  Filled 2019-10-16: qty 1
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
## 2019-10-16 NOTE — Progress Notes (Signed)
Inpatient Rehab Admissions Coordinator:   Still no determination from insurance company.  Spoke to patient at bedside regarding CLOF (supervision for mobility with OT, min/mod for ADLs) and progress towards safe d/c home.  Reached out to PT to see if they could begin stair training with pt as it appears CIR will not be an option at this time.  Pt states she is okay with going home as long as she has DME needed, and is safe getting into her home.  TOC aware.   Estill Dooms, PT, DPT Admissions Coordinator [phone] 10/16/19  4:58 PM
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
## 2019-10-16 NOTE — Progress Notes (Signed)
ORTHOPAEDIC PROGRESS NOTE  s/p Procedure(s): RECONSTRUCTION ANTERIOR CRUCIATE LIGAMENT (ACL) RECONSTRUCTION POSTERIOR CRUCIATE LIGAMENT (PCL) POSTERIOR LATERAL CORNER RECONSTRUCTION REPAIR MEDIAL COLLATERAL LIGAMENT (MCL) On 10/09/19 by Dr. Everardo Pacific  SUBJECTIVE: Patient was resting comfortably in her hospital bed. She is motivated to work with therapies and get off IV pain medications. Dilaudid was discontinued yesterday. She notes her right foot continues to remain swollen.   OBJECTIVE: PE: General: patient laying in hospital bed, no acute distress Right lower extremity: incisions CDI, steri-strips in place, Bledsoe brace locked in extension, ankle brace in place, intact EHL/TA/GSC, sensation intact distally, swelling about the right foot, warm well perfused foot, compartments are soft and compressible.    Vitals:   10/16/19 0334 10/16/19 0800  BP: (!) 143/76 126/68  Pulse: (!) 107 (!) 102  Resp: 20 (!) 30  Temp: 97.9 F (36.6 C) 97.7 F (36.5 C)  SpO2: 99% 98%   Imaging: X-rays of right knee were ordered on 10/15/2019 - stable post-operative findings.  ASSESSMENT: Jacqueline Erickson is a 29 y.o. female status post right knee multi-ligament reconstruction. POD#7  PLAN: Weightbearing: Touch-down weight bearing RLE. Knee brace locked in extension at all times.  Insicional and dressing care: Incision can be left open to air - reinforce as needed. Leave steri-strips in place.  Orthopedic device(s): Bledsoe right knee brace. Removable right ankle brace.  Showering: Post-op day #3 with assistance.  VTE prophylaxis: per trauma Pain control: PRN medications. Minimize narcotics as able.  Follow - up plan: 3 weeks in office with Dr. Everardo Pacific for incision check and X-ray.  Dispo: Inpatient rehab - currently awaiting insurance authorization.   Dr. Austin Miles physical therapy protocol for multi-ligamentous reconstruction was printed and placed in patient's chart. Protocol is attached to  this note as well for additional reference.   Once ambulating well and cleared by trauma team and therapies, patient is cleared for discharge to inpatient rehab from orthopedics standpoint.   Alfonse Alpers, PA-C 10/16/2019    PHYSICAL THERAPY PROTOCOL Multi-ligamentous Knee reconstruction  Ramond Marrow MD, MPH [facility] [address] [phone] (tel)   [phone] (fax)  Procedure:  Multi-ligamentous knee reconstruction - Right Knee   ACL    +     PCL    +   Posterolateral Corner    +    Posteromedial Corner/MCL  __ Evaluate and Treat - no weighted open chain or isokinetic exercises  __ Provide patient with home exercise program   *If PCL or Posterolateral Corner: No resisted knee flexion or    hyper-extension x 6 months.   __ Phase I (0-6 wks): Period of protection** - Non weight-bearing with brace locked in extension. Touch down weight-bearing is allowed for transfers only. Brace at all times except for PT, hygiene. - ROM: brace should be locked in extension. Starting in week 2, begin progressive passive and active-assisted ROM from 0 to 90 degrees.  - Goal: full extension to 90 degrees of flexion by week 6. - Patellar mobilization: 5-10 minutes daily. - Strengthening: quad sets, SLRs with knee locked in extension. No restrictions to ankle/hip strengthening.    __ Phase II (6-12 wks): Transition phase.  - Gradually progress weight bearing with the brace progressively unlocked.  - Week 7: 25% weight bearing with brace locked in extension; Week 8: 50% weight-bearing with brace locked in extension, Week 9: 75% weight-bearing with brace unlocked 0-30, Week 10: full-weight-bearing with brace unlocked 0-90. D/C brace after week 10 if good  quad control achieved.  - ROM: Advance active and passive ROM as tolerated. End range stretching may be accompanied by weighted prone heel hangs if full extension is not yet achieved. In some cases, static  progressive bracing may be prescribed. Goal: full motion by 3 months. - Strengthening: Advance isometric quad and hamstring strengthening. Begin and advance closed-chain strengthening (0-90 degrees) once full-weightbearing (ie. Week 10). Add pulley weights, theraband, etc..    __ Phase III (3-9 months): Advance conditioning and transition back to full activities. - Aggressive end-range stretching if full ROM not yet achieved. - Advance strengthening as tolerated, with an aggressive focus on closed-chain exercises. Increase resistance on equipment. - Begin plyometrics and increase as tolerated, starting sport-specific drills around 4-6 months.  - Begin to wean from formal supervised therapy encouraging independence with home exercise program. - Patients may return to full activities once motion is adequate and strength is at least 80% of the opposite side (usually around 9 months postoperatively). MMI is variable - depending on the extent of reconstruction - but is usually by 9-12 months post-reconstruction.

## 2019-10-17 LAB — CBC
HCT: 28.6 % — ABNORMAL LOW (ref 36.0–46.0)
Hemoglobin: 9.1 g/dL — ABNORMAL LOW (ref 12.0–15.0)
MCH: 31.3 pg (ref 26.0–34.0)
MCHC: 31.8 g/dL (ref 30.0–36.0)
MCV: 98.3 fL (ref 80.0–100.0)
Platelets: 577 10*3/uL — ABNORMAL HIGH (ref 150–400)
RBC: 2.91 MIL/uL — ABNORMAL LOW (ref 3.87–5.11)
RDW: 13.2 % (ref 11.5–15.5)
WBC: 16.4 10*3/uL — ABNORMAL HIGH (ref 4.0–10.5)
nRBC: 0 % (ref 0.0–0.2)

## 2019-10-17 LAB — BASIC METABOLIC PANEL
Anion gap: 10 (ref 5–15)
BUN: 8 mg/dL (ref 6–20)
CO2: 24 mmol/L (ref 22–32)
Calcium: 8.9 mg/dL (ref 8.9–10.3)
Chloride: 102 mmol/L (ref 98–111)
Creatinine, Ser: 0.73 mg/dL (ref 0.44–1.00)
GFR calc Af Amer: >60 mL/min (ref >60)
GFR calc non Af Amer: >60 mL/min (ref >60)
Glucose, Bld: 100 mg/dL — ABNORMAL HIGH (ref 70–99)
Potassium: 4.3 mmol/L (ref 3.5–5.1)
Sodium: 136 mmol/L (ref 135–145)

## 2019-10-17 LAB — PHOSPHORUS: Phosphorus: 4.9 mg/dL — ABNORMAL HIGH (ref 2.5–4.6)

## 2019-10-17 LAB — MAGNESIUM: Magnesium: 2 mg/dL (ref 1.7–2.4)

## 2019-10-17 MED ORDER — GABAPENTIN 400 MG PO CAPS
500.0000 mg | ORAL_CAPSULE | Freq: Three times a day (TID) | ORAL | Status: DC
  Administered 2019-10-17 – 2019-10-19 (×6): 500 mg via ORAL
  Filled 2019-10-17 (×6): qty 1
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
## 2019-10-18 NOTE — Progress Notes (Signed)
At 0005 on 10/18/19, patient reported that she could not find her Apple Airpods that she has last seen on her bedside table at 1100 on 10/17/19. She said they are just in the classic white case. RN looked in the sheets, on the floor, and around the room, but could not find them. RN requested to charge nurse that the event is discussed during huddle so that staff will keep an eye out for the Airpods.

## 2019-10-19 ENCOUNTER — Encounter: Payer: Self-pay | Admitting: Adult Health

## 2019-10-19 ENCOUNTER — Other Ambulatory Visit: Payer: Self-pay

## 2019-10-19 ENCOUNTER — Encounter (HOSPITAL_COMMUNITY): Payer: Self-pay | Admitting: Physical Medicine and Rehabilitation

## 2019-10-19 ENCOUNTER — Inpatient Hospital Stay (HOSPITAL_COMMUNITY)
Admission: RE | Admit: 2019-10-19 | Discharge: 2019-10-24 | DRG: 560 | Disposition: A | Discharge status: Home Health | Payer: 59 | Source: Intra-hospital | Attending: Physical Medicine and Rehabilitation | Admitting: Physical Medicine and Rehabilitation

## 2019-10-19 DIAGNOSIS — S01311D Laceration without foreign body of right ear, subsequent encounter: Secondary | ICD-10-CM | POA: Diagnosis not present

## 2019-10-19 DIAGNOSIS — S40812D Abrasion of left upper arm, subsequent encounter: Secondary | ICD-10-CM | POA: Diagnosis not present

## 2019-10-19 DIAGNOSIS — D72829 Elevated white blood cell count, unspecified: Secondary | ICD-10-CM | POA: Diagnosis present

## 2019-10-19 DIAGNOSIS — S40811D Abrasion of right upper arm, subsequent encounter: Secondary | ICD-10-CM

## 2019-10-19 DIAGNOSIS — S76111D Strain of right quadriceps muscle, fascia and tendon, subsequent encounter: Secondary | ICD-10-CM | POA: Diagnosis not present

## 2019-10-19 DIAGNOSIS — S83241A Other tear of medial meniscus, current injury, right knee, initial encounter: Secondary | ICD-10-CM | POA: Diagnosis not present

## 2019-10-19 DIAGNOSIS — K5900 Constipation, unspecified: Secondary | ICD-10-CM | POA: Diagnosis present

## 2019-10-19 DIAGNOSIS — R000 Tachycardia, unspecified: Secondary | ICD-10-CM | POA: Diagnosis present

## 2019-10-19 DIAGNOSIS — F329 Major depressive disorder, single episode, unspecified: Secondary | ICD-10-CM

## 2019-10-19 DIAGNOSIS — S32019D Unspecified fracture of first lumbar vertebra, subsequent encounter for fracture with routine healing: Secondary | ICD-10-CM | POA: Diagnosis present

## 2019-10-19 DIAGNOSIS — Z8349 Family history of other endocrine, nutritional and metabolic diseases: Secondary | ICD-10-CM | POA: Diagnosis not present

## 2019-10-19 DIAGNOSIS — Z8249 Family history of ischemic heart disease and other diseases of the circulatory system: Secondary | ICD-10-CM | POA: Diagnosis not present

## 2019-10-19 DIAGNOSIS — Y92410 Unspecified street and highway as the place of occurrence of the external cause: Secondary | ICD-10-CM | POA: Diagnosis not present

## 2019-10-19 DIAGNOSIS — S32029D Unspecified fracture of second lumbar vertebra, subsequent encounter for fracture with routine healing: Secondary | ICD-10-CM | POA: Diagnosis not present

## 2019-10-19 DIAGNOSIS — M23611 Other spontaneous disruption of anterior cruciate ligament of right knee: Secondary | ICD-10-CM | POA: Diagnosis present

## 2019-10-19 DIAGNOSIS — Z825 Family history of asthma and other chronic lower respiratory diseases: Secondary | ICD-10-CM

## 2019-10-19 DIAGNOSIS — T07XXXA Unspecified multiple injuries, initial encounter: Secondary | ICD-10-CM | POA: Diagnosis present

## 2019-10-19 DIAGNOSIS — D62 Acute posthemorrhagic anemia: Secondary | ICD-10-CM | POA: Diagnosis present

## 2019-10-19 DIAGNOSIS — S83521S Sprain of posterior cruciate ligament of right knee, sequela: Secondary | ICD-10-CM

## 2019-10-19 DIAGNOSIS — S83411D Sprain of medial collateral ligament of right knee, subsequent encounter: Secondary | ICD-10-CM | POA: Diagnosis not present

## 2019-10-19 DIAGNOSIS — F17210 Nicotine dependence, cigarettes, uncomplicated: Secondary | ICD-10-CM | POA: Diagnosis present

## 2019-10-19 DIAGNOSIS — F1010 Alcohol abuse, uncomplicated: Secondary | ICD-10-CM | POA: Diagnosis present

## 2019-10-19 DIAGNOSIS — F3289 Other specified depressive episodes: Secondary | ICD-10-CM | POA: Diagnosis present

## 2019-10-19 DIAGNOSIS — S83511A Sprain of anterior cruciate ligament of right knee, initial encounter: Secondary | ICD-10-CM | POA: Diagnosis not present

## 2019-10-19 DIAGNOSIS — S30811D Abrasion of abdominal wall, subsequent encounter: Secondary | ICD-10-CM

## 2019-10-19 DIAGNOSIS — S83241D Other tear of medial meniscus, current injury, right knee, subsequent encounter: Secondary | ICD-10-CM | POA: Diagnosis not present

## 2019-10-19 DIAGNOSIS — R350 Frequency of micturition: Secondary | ICD-10-CM | POA: Diagnosis present

## 2019-10-19 DIAGNOSIS — R519 Headache, unspecified: Secondary | ICD-10-CM | POA: Diagnosis present

## 2019-10-19 DIAGNOSIS — Z833 Family history of diabetes mellitus: Secondary | ICD-10-CM

## 2019-10-19 DIAGNOSIS — S83511D Sprain of anterior cruciate ligament of right knee, subsequent encounter: Secondary | ICD-10-CM | POA: Diagnosis not present

## 2019-10-19 LAB — CBC
HCT: 28.3 % — ABNORMAL LOW (ref 36.0–46.0)
Hemoglobin: 9 g/dL — ABNORMAL LOW (ref 12.0–15.0)
MCH: 31.3 pg (ref 26.0–34.0)
MCHC: 31.8 g/dL (ref 30.0–36.0)
MCV: 98.3 fL (ref 80.0–100.0)
Platelets: 596 10*3/uL — ABNORMAL HIGH (ref 150–400)
RBC: 2.88 MIL/uL — ABNORMAL LOW (ref 3.87–5.11)
RDW: 13.5 % (ref 11.5–15.5)
WBC: 13.5 10*3/uL — ABNORMAL HIGH (ref 4.0–10.5)
nRBC: 0 % (ref 0.0–0.2)

## 2019-10-19 LAB — CREATININE, SERUM
Creatinine, Ser: 0.67 mg/dL (ref 0.44–1.00)
GFR calc Af Amer: >60 mL/min (ref >60)
GFR calc non Af Amer: >60 mL/min (ref >60)

## 2019-10-19 MED ORDER — OFLOXACIN 0.3 % OP SOLN
4.0000 [drp] | Freq: Two times a day (BID) | OPHTHALMIC | Status: AC
  Administered 2019-10-19 – 2019-10-20 (×3): 4 [drp] via OTIC
  Filled 2019-10-19: qty 5

## 2019-10-19 MED ORDER — ENSURE ENLIVE PO LIQD: 237.0000 mL | Freq: Two times a day (BID) | ORAL | Status: DC

## 2019-10-19 MED ORDER — POLYETHYLENE GLYCOL 3350 17 G PO PACK
17.0000 g | PACK | Freq: Two times a day (BID) | ORAL | Status: DC
  Administered 2019-10-19 – 2019-10-22 (×5): 17 g via ORAL
  Filled 2019-10-19 (×10): qty 1

## 2019-10-19 MED ORDER — ACETAMINOPHEN 325 MG PO TABS
650.0000 mg | ORAL_TABLET | Freq: Four times a day (QID) | ORAL | Status: DC | PRN
  Administered 2019-10-22 – 2019-10-24 (×6): 650 mg via ORAL
  Filled 2019-10-19 (×7): qty 2

## 2019-10-19 MED ORDER — SENNOSIDES-DOCUSATE SODIUM 8.6-50 MG PO TABS
1.0000 | ORAL_TABLET | Freq: Two times a day (BID) | ORAL | Status: DC
  Administered 2019-10-19 – 2019-10-24 (×11): 1 via ORAL
  Filled 2019-10-19 (×11): qty 1

## 2019-10-19 MED ORDER — FOLIC ACID 1 MG PO TABS
1.0000 mg | ORAL_TABLET | Freq: Every day | ORAL | Status: DC
  Administered 2019-10-19 – 2019-10-24 (×6): 1 mg via ORAL
  Filled 2019-10-19 (×6): qty 1

## 2019-10-19 MED ORDER — ADULT MULTIVITAMIN W/MINERALS CH
1.0000 | ORAL_TABLET | Freq: Every day | ORAL | Status: DC
  Administered 2019-10-19 – 2019-10-24 (×6): 1 via ORAL
  Filled 2019-10-19 (×6): qty 1

## 2019-10-19 MED ORDER — TRAMADOL HCL 50 MG PO TABS
100.0000 mg | ORAL_TABLET | Freq: Three times a day (TID) | ORAL | Status: DC
  Administered 2019-10-19 – 2019-10-24 (×15): 100 mg via ORAL
  Filled 2019-10-19 (×16): qty 2

## 2019-10-19 MED ORDER — ONDANSETRON HCL 4 MG PO TABS: 4.0000 mg | ORAL_TABLET | Freq: Four times a day (QID) | ORAL | Status: DC | PRN

## 2019-10-19 MED ORDER — THIAMINE HCL 100 MG PO TABS
100.0000 mg | ORAL_TABLET | Freq: Every day | ORAL | Status: DC
  Administered 2019-10-19 – 2019-10-24 (×6): 100 mg via ORAL
  Filled 2019-10-19 (×6): qty 1

## 2019-10-19 MED ORDER — FERROUS SULFATE 325 (65 FE) MG PO TABS
325.0000 mg | ORAL_TABLET | Freq: Two times a day (BID) | ORAL | Status: DC
  Administered 2019-10-19 – 2019-10-24 (×10): 325 mg via ORAL
  Filled 2019-10-19 (×10): qty 1

## 2019-10-19 MED ORDER — ENOXAPARIN SODIUM 40 MG/0.4ML ~~LOC~~ SOLN: 40.0000 mg | Freq: Two times a day (BID) | SUBCUTANEOUS | Status: DC

## 2019-10-19 MED ORDER — ONDANSETRON HCL 4 MG/2ML IJ SOLN: 4.0000 mg | Freq: Four times a day (QID) | INTRAMUSCULAR | Status: DC | PRN

## 2019-10-19 MED ORDER — TRAZODONE HCL 50 MG PO TABS
50.0000 mg | ORAL_TABLET | Freq: Every evening | ORAL | Status: DC | PRN
  Administered 2019-10-23: 21:00:00 50 mg via ORAL
  Filled 2019-10-19 (×2): qty 1

## 2019-10-19 MED ORDER — ENOXAPARIN SODIUM 40 MG/0.4ML ~~LOC~~ SOLN
40.0000 mg | Freq: Two times a day (BID) | SUBCUTANEOUS | Status: DC
  Administered 2019-10-19 – 2019-10-24 (×10): 40 mg via SUBCUTANEOUS
  Filled 2019-10-19 (×12): qty 0.4

## 2019-10-19 MED ORDER — GABAPENTIN 400 MG PO CAPS
500.0000 mg | ORAL_CAPSULE | Freq: Three times a day (TID) | ORAL | Status: DC
  Administered 2019-10-19 – 2019-10-24 (×15): 500 mg via ORAL
  Filled 2019-10-19 (×15): qty 1

## 2019-10-19 MED ORDER — OXYCODONE HCL 5 MG PO TABS
10.0000 mg | ORAL_TABLET | ORAL | Status: DC | PRN
  Administered 2019-10-19 – 2019-10-20 (×5): 15 mg via ORAL
  Administered 2019-10-20: 02:00:00 10 mg via ORAL
  Filled 2019-10-19: qty 2
  Filled 2019-10-19 (×6): qty 3

## 2019-10-19 MED ORDER — BISACODYL 5 MG PO TBEC: 5.0000 mg | DELAYED_RELEASE_TABLET | Freq: Every day | ORAL | Status: DC | PRN

## 2019-10-19 MED ORDER — LIP MEDEX EX OINT
TOPICAL_OINTMENT | CUTANEOUS | Status: DC | PRN
  Filled 2019-10-19: qty 7

## 2019-10-19 MED ORDER — CYCLOBENZAPRINE HCL 10 MG PO TABS
10.0000 mg | ORAL_TABLET | Freq: Three times a day (TID) | ORAL | Status: DC
  Administered 2019-10-19 – 2019-10-24 (×15): 10 mg via ORAL
  Filled 2019-10-19 (×15): qty 1

## 2019-10-19 MED ORDER — ASCORBIC ACID 500 MG PO TABS
500.0000 mg | ORAL_TABLET | Freq: Every day | ORAL | Status: DC
  Administered 2019-10-19 – 2019-10-24 (×6): 500 mg via ORAL
  Filled 2019-10-19 (×6): qty 1

## 2019-10-19 MED ORDER — SORBITOL 70 % SOLN: 30.0000 mL | Freq: Every day | Status: DC | PRN

## 2019-10-19 NOTE — Progress Notes (Signed)
Inpatient Rehab Admissions Coordinator:   I have insurance authorization and a bed available for pt to admit to CIR today.  Kelly Rayburn, PA-C in agreement.  I will let pt and CM know.   Estill Dooms, PT, DPT Admissions Coordinator [phone] 10/19/19  10:02 AM
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
## 2019-10-19 NOTE — Progress Notes (Signed)
Patient discharged to CIR. Report was called, RN at rehab request to leave peripheral IV intact. Patient belongings were packed and patient taken over in bed.
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
## 2019-10-19 NOTE — TOC Transition Note (Signed)
Transition of Care [facility]) - CM/SW Discharge Note   Patient Details  Name: Jacqueline Erickson MRN: [id number] Date of Birth: 1990-11-08  [facility]) CM/SW Contact:  Glennon Mac, RN Phone Number: 10/19/2019, 11:46 AM   Clinical Narrative:    Pt medically stable for discharge, and insurance has accepted pt for admission to [facility] IP Rehab today.  Plan dc to CIR later today when bed available.        Barriers to Discharge: Continued Medical Work up   Patient Goals and CMS Choice Patient states their goals for this hospitalization and ongoing recovery are:: to be able to go home CMS Medicare.gov Compare Post Acute Care list provided to:: Patient Choice offered to / list presented to : Patient           Discharge Plan and Services   Discharge Planning Services: CM Consult, MATCH Program, Medication Assistance Post Acute Care Choice: Home Health          DME Arranged: 3-N-1, Walker rolling DME Agency: [employer] Date DME Agency Contacted: 10/07/19 Time DME Agency Contacted: 1438 Representative spoke with at DME Agency: Oletha Cruel HH Arranged: PT, OT HH Agency: [facility] Date HH Agency Contacted: 10/07/19 Time HH Agency Contacted: 1000 Representative spoke with at [facility] Agency: Jeryl Columbia  Social Determinants of Health (SDOH) Interventions     Readmission Risk Interventions No flowsheet data found.  Quintella Baton, RN, BSN  Trauma/Neuro ICU Case Manager [phone]
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
## 2019-10-19 NOTE — Plan of Care (Signed)

## 2019-10-20 ENCOUNTER — Inpatient Hospital Stay (HOSPITAL_COMMUNITY): Payer: Medicaid Other | Admitting: Occupational Therapy

## 2019-10-20 ENCOUNTER — Inpatient Hospital Stay (HOSPITAL_COMMUNITY): Payer: Medicaid Other

## 2019-10-20 ENCOUNTER — Encounter (HOSPITAL_COMMUNITY): Payer: Medicaid Other | Admitting: Psychology

## 2019-10-20 DIAGNOSIS — F329 Major depressive disorder, single episode, unspecified: Secondary | ICD-10-CM

## 2019-10-20 DIAGNOSIS — S83241A Other tear of medial meniscus, current injury, right knee, initial encounter: Secondary | ICD-10-CM | POA: Diagnosis present

## 2019-10-20 DIAGNOSIS — S83511A Sprain of anterior cruciate ligament of right knee, initial encounter: Secondary | ICD-10-CM | POA: Diagnosis present

## 2019-10-20 DIAGNOSIS — T07XXXA Unspecified multiple injuries, initial encounter: Secondary | ICD-10-CM | POA: Diagnosis present

## 2019-10-20 DIAGNOSIS — K5900 Constipation, unspecified: Secondary | ICD-10-CM | POA: Diagnosis present

## 2019-10-20 DIAGNOSIS — S83521S Sprain of posterior cruciate ligament of right knee, sequela: Secondary | ICD-10-CM

## 2019-10-20 LAB — CBC WITH DIFFERENTIAL/PLATELET
Abs Immature Granulocytes: 0.49 10*3/uL — ABNORMAL HIGH (ref 0.00–0.07)
Basophils Absolute: 0 10*3/uL (ref 0.0–0.1)
Basophils Relative: 0 %
Eosinophils Absolute: 0.2 10*3/uL (ref 0.0–0.5)
Eosinophils Relative: 2 %
HCT: 27 % — ABNORMAL LOW (ref 36.0–46.0)
Hemoglobin: 8.5 g/dL — ABNORMAL LOW (ref 12.0–15.0)
Immature Granulocytes: 4 %
Lymphocytes Relative: 19 %
Lymphs Abs: 2.3 10*3/uL (ref 0.7–4.0)
MCH: 30.8 pg (ref 26.0–34.0)
MCHC: 31.5 g/dL (ref 30.0–36.0)
MCV: 97.8 fL (ref 80.0–100.0)
Monocytes Absolute: 1.3 10*3/uL — ABNORMAL HIGH (ref 0.1–1.0)
Monocytes Relative: 10 %
Neutro Abs: 7.9 10*3/uL — ABNORMAL HIGH (ref 1.7–7.7)
Neutrophils Relative %: 65 %
Platelets: 575 10*3/uL — ABNORMAL HIGH (ref 150–400)
RBC: 2.76 MIL/uL — ABNORMAL LOW (ref 3.87–5.11)
RDW: 13.8 % (ref 11.5–15.5)
WBC: 12.1 10*3/uL — ABNORMAL HIGH (ref 4.0–10.5)
nRBC: 0 % (ref 0.0–0.2)

## 2019-10-20 LAB — COMPREHENSIVE METABOLIC PANEL
ALT: 54 U/L — ABNORMAL HIGH (ref 0–44)
AST: 37 U/L (ref 15–41)
Albumin: 2.5 g/dL — ABNORMAL LOW (ref 3.5–5.0)
Alkaline Phosphatase: 68 U/L (ref 38–126)
Anion gap: 9 (ref 5–15)
BUN: 7 mg/dL (ref 6–20)
CO2: 22 mmol/L (ref 22–32)
Calcium: 9.2 mg/dL (ref 8.9–10.3)
Chloride: 103 mmol/L (ref 98–111)
Creatinine, Ser: 0.76 mg/dL (ref 0.44–1.00)
GFR calc Af Amer: >60 mL/min (ref >60)
GFR calc non Af Amer: >60 mL/min (ref >60)
Glucose, Bld: 91 mg/dL (ref 70–99)
Potassium: 4.3 mmol/L (ref 3.5–5.1)
Sodium: 134 mmol/L — ABNORMAL LOW (ref 135–145)
Total Bilirubin: 0.3 mg/dL (ref 0.3–1.2)
Total Protein: 6.1 g/dL — ABNORMAL LOW (ref 6.5–8.1)
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
## 2019-10-20 NOTE — Progress Notes (Signed)
Inpatient Rehabilitation  Patient information reviewed and entered into eRehab system by Amor Hyle M. Jordis Repetto, M.A., CCC/SLP, PPS Coordinator.  Information including medical coding, functional ability and quality indicators will be reviewed and updated through discharge.    

## 2019-10-20 NOTE — Plan of Care (Signed)
  Problem: Consults Goal: RH GENERAL PATIENT EDUCATION Description: See Patient Education module for education specifics. Outcome: Progressing   Problem: RH SKIN INTEGRITY Goal: RH STG SKIN FREE OF INFECTION/BREAKDOWN Description: Skin remains free of infection and break down, MOD I Outcome: Progressing   Problem: RH PAIN MANAGEMENT Goal: RH STG PAIN MANAGED AT OR BELOW PT'S PAIN GOAL Description: Goal <4 Outcome: Progressing

## 2019-10-20 NOTE — Progress Notes (Signed)
Occupational Therapy Session Note  Patient Details  Name: KEYONNI PERCIVAL MRN: [id number] Date of Birth: 08/10/1991  Today's Date: 10/20/2019 OT Individual Time: 1020-1100 OT Individual Time Calculation (min): 40 min   Skilled Therapeutic Interventions/Progress Updates:    1:1 Pt up and in recliner when arrived. Discussed d/c planning and home setup and goals for home. Pt reported feeling good after earlier OT session. Pt Performed transfer to recliner about 5 feet to w/c with supervision with RW. Pt transported to the gym. Pt reports having a very high bed at home and usually gets in the bed putting knee up in the bed. Pt unable to access bed this way due to precautions. Pt performed bed mobility on mat raised to its highest level with initially mod A and then progressing to min A with practice. Pt performed task 3 times. Also discuss other options for bed (either her mother's bed or remove box springs). Returned to room via w/c and transferred into recliner with supervision. Ice applied to knee for swelling and comfort.   Therapy Documentation Precautions:  Right bledsoe brace on at all times., right ASO and right LE TDWB  Pain: No c/o pain in session   Therapy/Group: Individual Therapy  Roney Mans [facility] 10/20/2019, 11:06 AM
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
## 2019-10-21 ENCOUNTER — Inpatient Hospital Stay (HOSPITAL_COMMUNITY): Payer: 59

## 2019-10-21 ENCOUNTER — Inpatient Hospital Stay (HOSPITAL_COMMUNITY): Payer: 59 | Admitting: Occupational Therapy

## 2019-10-21 MED ORDER — OXYCODONE HCL 5 MG PO TABS
15.0000 mg | ORAL_TABLET | ORAL | Status: DC | PRN
  Administered 2019-10-21 (×2): 20 mg via ORAL
  Administered 2019-10-21: 15 mg via ORAL
  Administered 2019-10-22 – 2019-10-24 (×6): 20 mg via ORAL
  Filled 2019-10-21 (×6): qty 4
  Filled 2019-10-21: qty 3
  Filled 2019-10-21: qty 4

## 2019-10-21 NOTE — Progress Notes (Addendum)
Occupational Therapy Session Note  Patient Details  Name: Jacqueline Erickson MRN: [id number] Date of Birth: 1990/12/03  Today's Date: 10/21/2019 OT Individual Time: 1100-1200 OT Individual Time Calculation (min): 60 min  2nd session 13:00-13:45 ( )   Short Term Goals: Week 1:  OT Short Term Goal 1 (Week 1): Patient will complete LB dressing and brace management with CS OT Short Term Goal 2 (Week 1): patient will complete basic HM with CS using RW  Skilled Therapeutic Interventions/Progress Updates:    1st session: 1:1 Pt up and moving around in room demonstrating safe mobility with RW. Pt made mod I for transfers in the room with RW. Pt performed bed making remaining on one side of the bed with supervision with therapist assisting with other side. Continued discussion and problem solving with d/c plans. Long discussion and problem solving climbing stairs to access her apartment. Explored option of using a shower chair to sit and progress up the stairs. However due to her short height and LE having to be out in extension; pt unable to perform this method. Pt performed stair climbing of 11 steps in the hallway and then rested in a chair and then descended down the stairs with min guard with crutch and rail on the left.   2nd session 1:1 self care retraining continuing to work on accessing home. Climbed first flight of stairs with min guard with crutch and rail. After seated rest climbed another flight. PT with increased pain and wanted to rest. Pt taken elevated back down to the rehab floor (pt will not climb and then decent that many stairs at one time.). Pt will practice descending down two flights tomorrow. Pt's HR in 140s after each flight. Rest after each one to allow HR to decrease. Left resting in the bed with ice applied to knee.   Therapy Documentation Precautions:  Precautions Precautions: Fall, Knee Precaution Comments: R knee pain, back TP L1-2 fxs, back precautions for  comfort Required Braces or Orthoses: Other Brace Other Brace: Bledsoe brace locked in extension R LE, ASO R ankle (also has camboot) Restrictions Weight Bearing Restrictions: Yes RLE Weight Bearing: Touchdown weight bearing Other Position/Activity Restrictions: TDWB following knee repair 10/09/19 Pain: Pt with ongoing discomfort in right knee but with rest breaks as needed and ice applied at end of second session. Le elevated after both sessions to assist with swelling.   Therapy/Group: Individual Therapy  Roney Mans [facility] 10/21/2019, 3:42 PM

## 2019-10-21 NOTE — Progress Notes (Signed)
Physical Therapy Session Note  Patient Details  Name: Jacqueline Erickson MRN: [id number] Date of Birth: [birth year]  Today's Date: 10/21/2019 PT Individual Time: 0900-1015 PT Individual Time Calculation (min): 75 min   Short Term Goals: Week 1:  PT Short Term Goal 1 (Week 1): STG=LTG due to short ELOS.  Skilled Therapeutic Interventions/Progress Updates:     Patient in bed upon PT arrival. Patient alert and agreeable to PT session. Patient reported 6/10 R knee pain during session, reported that she was pre-medicated prior to session. PT provided repositioning, rest breaks, and distraction as pain interventions throughout session. Patient adjusted R Bledsoe brace in the bed independently prior to session. PT educated on proper placement and tightening to avoid the brace slipping in standing. Patient readjusted the brace with min A for demonstration for placement. She had the R ASO donned prior to session. PT donned non-skid sock to R foot with max A for time management. Patient donned L tennis shoe independently for gait training.   Vitals: BP 120/60, HR 118, SPO2 100% on RA Discussed elevated HR with Dan, PA, and informed him of HR >140 with activity yesterday. Dan, PA, recommended to monitor for signs and symptoms as indicators to stop activity, otherwise just monitor HR without restrictions.    Therapeutic Activity: Bed Mobility: Patient performed supine to sit independently in a flat bed without use of bed rails. Transfers: Patient performed sit to/from stand x4 and stand pivot transfers x2 with supervision progressing to mod I using a RW. Patient maintained R LE TTWB precautions without cues with all transfers Provided verbal cues for pushing up with L  UE from a surface or arm rest and R UE on the RW x1.  Gait Training:  Patient ambulated 5 feet and 40 feet using RW with CGA-close supervision with w/c follow due to decreased activity tolerance. Ambulated with hop-to gait pattern on L in  decreased control at initial contace. Provided verbal cues for increased UE use to control initial contact and reduce impact on L foot, also adjusted patient's RW to optimize UE use with gait.  Patient ascended/descended 4-6" steps using a L rail and crutch with min A-CGA x2. Performed hop-to gait pattern on L, trailing R LE while ascending and holding R LE in front while descending. Provided demonstration of technique and multimodal cues for technique, sequencing, and maintaining TTWB or NWB on R LE, as patient tended to place toes down with some pressure while descending due to decreased confidence affecting balance, improved during second trial.   Wheelchair Mobility:  Patient propelled wheelchair 162 feet with mod I. Provided verbal cues for donning/doffing leg rests with min A. Patient was independent with use of breaks and frequently checked that breaks were locked appropriately throughout session.  Patient in bed at end of session with breaks locked and all needs within reach. Educated patient on energy conservation and monitoring HR by checking manual pulse independently throughout session.  Patients HR elevated to 140-147 with activity and recovered to 117-122 at rest during session.    Therapy Documentation Precautions:  Precautions Precautions: Fall, Knee Precaution Comments: R knee pain, back TP L1-2 fxs, back precautions for comfort Required Braces or Orthoses: Other Brace Other Brace: Bledsoe brace locked in extension R LE, ASO R ankle (also has camboot) Restrictions Weight Bearing Restrictions: Yes RLE Weight Bearing: Touchdown weight bearing Other Position/Activity Restrictions: TDWB following knee repair 10/09/19    Therapy/Group: Individual Therapy  Haila Dena L Chanie Soucek PT, DPT  10/21/2019, 12:34 PM

## 2019-10-21 NOTE — Progress Notes (Signed)
Social Work Patient ID: Marilynn Latino, female   DOB: May 22, 1991, 29 y.o.   MRN: [id number]   SW met with pt in room to provide updates from rehab team, and anticipated discharge date within 1-2 weeks. Pt asked about [place] services. SW explained process on arranging services ([place] or OPT) based on therapy recommendations. SW observed pt DME: 3in1 BSC and RW has been found and in patient room.    Loralee Pacas, MSW, [place] Office: [phone] Cell: [phone] Fax: [phone]
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
## 2019-10-21 NOTE — Progress Notes (Signed)
Social Work Patient ID: Jacqueline Erickson, female   DOB: Oct 01, 1990, 29 y.o.   MRN: [id number]    SW called [employer] [phone]) to provide updates. SW left message on confidential voice mail requesting return phone call.  *SW later on hold for close to 1hr to provide updates. No live rep. SW faxed updates to fax#[phone].   Cecile Sheerer, MSW, LCSWA Office: [phone] Cell: [phone] Fax: [phone]

## 2019-10-21 NOTE — Progress Notes (Signed)
Occupational Therapy Session Note  Patient Details  Name: Jacqueline Erickson MRN: [id number] Date of Birth: [birth year]  Today's Date: 10/21/2019 OT Individual Time: 1530-1600 OT Individual Time Calculation (min): 30 min    Short Term Goals: Week 1:  OT Short Term Goal 1 (Week 1): Patient will complete LB dressing and brace management with CS OT Short Term Goal 2 (Week 1): patient will complete basic HM with CS using RW  Skilled Therapeutic Interventions/Progress Updates:    Upon entering the room, pt supine in bed and reports 10/10 pain in R knee. Pt declines OOB activity secondary to increased pain and having just received pain medication. OT educated pt on pain medication for therapy participation, keeping track and asking for it when it is due, and knowing what will be available at discharge in order to attempt to decrease when able. Pt verbalized understanding and voiced that this was one of her greatest concerns. She also states feeling anxious about  going home too soon from therapy and not being ready. OT provided therapeutic use of self and discussed pt's progress and goals. Pt is very pleasant throughout session and discussed plan for proposed shower tomorrow and need to cover brace for session. Pt verbalized understanding. Pt remains in bed with call bell and all needed items within reach upon exiting the room.   Therapy Documentation Precautions:  Precautions Precautions: Fall, Knee Precaution Comments: R knee pain, back TP L1-2 fxs, back precautions for comfort Required Braces or Orthoses: Other Brace Other Brace: Bledsoe brace locked in extension R LE, ASO R ankle (also has camboot) Restrictions Weight Bearing Restrictions: Yes RLE Weight Bearing: Touchdown weight bearing Other Position/Activity Restrictions: TDWB following knee repair 10/09/19 Vital Signs: Therapy Vitals Temp: 98.3 F (36.8 C) Pulse Rate: (!) 118 Resp: 18 BP: 120/70 Patient Position (if appropriate):  Sitting Oxygen Therapy SpO2: 95 % O2 Device: Room Air Pain: Pain Assessment Pain Scale: 0-10 Pain Score: 10-Worst pain ever Pain Type: Acute pain;Surgical pain Pain Location: Leg Pain Orientation: Right Pain Descriptors / Indicators: Aching;Guarding;Discomfort Pain Frequency: Constant Pain Onset: On-going Patients Stated Pain Goal: 4 Pain Intervention(s): Medication (See eMAR) ADL: ADL Eating: Set up Where Assessed-Eating: Chair Grooming: Setup Where Assessed-Grooming: Sitting at sink, Chair Upper Body Bathing: Setup Where Assessed-Upper Body Bathing: Sitting at sink, Chair Lower Body Bathing: Minimal assistance Where Assessed-Lower Body Bathing: Sitting at sink, Chair Upper Body Dressing: Minimal assistance Where Assessed-Upper Body Dressing: Chair Lower Body Dressing: Moderate assistance Where Assessed-Lower Body Dressing: Chair Toileting: Contact guard Where Assessed-Toileting: Bedside Commode Toilet Transfer: Contact guard Toilet Transfer Method: Ambulating   Therapy/Group: Individual Therapy  Alen Bleacher 10/21/2019, 4:32 PM

## 2019-10-21 NOTE — Care Management (Addendum)
Inpatient Rehabilitation Center Individual Statement of Services  Patient Name:  Jacqueline Erickson  Date:  10/21/2019  Welcome to the Inpatient Rehabilitation [facility].  Our goal is to provide you with an individualized program based on your diagnosis and situation, designed to meet your specific needs.  With this comprehensive rehabilitation program, you will be expected to participate in at least 3 hours of rehabilitation therapies Monday-Friday, with modified therapy programming on the weekends.  Your rehabilitation program will include the following services:  Physical Therapy (PT), Occupational Therapy (OT), Speech Therapy (ST), 24 hour per day rehabilitation nursing, Therapeutic Recreaction (TR), Psychology, Neuropsychology, Case Management (Social Worker), Rehabilitation Medicine, Nutrition Services, Pharmacy Services and Other  Weekly team conferences will be held on Tuesdays to discuss your progress.  Your Social Worker will talk with you frequently to get your input and to update you on team discussions.  Team conferences with you and your family in attendance may also be held.  Expected length of stay: 1-2 weeks  Overall anticipated outcome: Supervision to Independent  Depending on your progress and recovery, your program may change. Your Social Worker will coordinate services and will keep you informed of any changes. Your Social Worker's name and contact numbers are listed  below.  The following services may also be recommended but are not provided by the Inpatient Rehabilitation Center:   Driving Evaluations  Home Health Rehabiltiation Services  Outpatient Rehabilitation Services  Vocational Rehabilitation   Arrangements will be made to provide these services after discharge if needed.  Arrangements include referral to agencies that provide these services.  Your insurance has been verified to be:  [facility] [employer]  Your primary doctor is:  [facility]   Pertinent  information will be shared with your doctor and your insurance company.  Social Worker:  Cecile Sheerer, LCSWA  Information discussed with and copy given to patient by: Gretchen Short, 10/21/2019, 10:02 AM
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
## 2019-10-22 ENCOUNTER — Inpatient Hospital Stay (HOSPITAL_COMMUNITY): Payer: 59

## 2019-10-22 ENCOUNTER — Inpatient Hospital Stay (HOSPITAL_COMMUNITY): Payer: 59 | Admitting: Occupational Therapy

## 2019-10-22 MED ORDER — METOPROLOL TARTRATE 12.5 MG HALF TABLET
12.5000 mg | ORAL_TABLET | Freq: Two times a day (BID) | ORAL | Status: DC
  Administered 2019-10-22 – 2019-10-24 (×5): 12.5 mg via ORAL
  Filled 2019-10-22 (×5): qty 1

## 2019-10-22 MED ORDER — CARVEDILOL 3.125 MG PO TABS: 3.1250 mg | ORAL_TABLET | Freq: Two times a day (BID) | ORAL | Status: DC
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
## 2019-10-22 NOTE — Progress Notes (Addendum)
Social Work Patient ID: Jacqueline Erickson, female   DOB: 1991/03/30, 29 y.o.   MRN: [id number]    *Return fax indicating that clinical notes faxed to [facility] (p: [phone]/f: [phone]) was received.   Per therapy recommendations pt needs w/c. SW waiting on follow-up from Zach[facility] [phone]) about DME pt previously received and if it was listed under [facility] as pt current insurance is not listed as accepted when trying to obtain w/c. Pt did not have insurance listed at time of admission when initial DME (RW and 3in1 BSC) was ordered.  SW followed up with Valerie/[facility] [phone]) to discuss accepted referral for HHPT/OT as pt was previously set up prior to transfer to unit; and now pt has insurance listed. SW waiting on follow-up if insurance is accepted.  *SW received updates from [place] indicating pt is still accepted with insurance. No concerns. Will need update on pt discharge date.   SW spoke with [facility]- reports DME was ordered under [facility] and confirms not in network with pt current insurance. SW ordered DME through [facility] via parachute app. SW requested for DME to be delivered tomorrow. Per therapy, pt is making gains and will have family edu tomorrow with her mother.   Cecile Sheerer, MSW, LCSWA Office: [phone] Cell: [phone] Fax: [phone]

## 2019-10-22 NOTE — Progress Notes (Signed)
Orthopedic Tech Progress Note Patient Details:  Jacqueline Erickson Jun 21, 1991 [id number]  Ortho Devices Type of Ortho Device: Crutches Ortho Device/Splint Interventions: Application   Post Interventions Patient Tolerated: Well Instructions Provided: Care of device   Saul Fordyce 10/22/2019, 4:14 PM
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
## 2019-10-22 NOTE — Progress Notes (Signed)
Physical Therapy Session Note  Patient Details  Name: Jacqueline Erickson MRN: [id number] Date of Birth: July 10, 1991  Today's Date: 10/22/2019 PT Individual Time: 6269-4854 and 1410-1510 PT Individual Time Calculation (min): 75 min and 60 min  Short Term Goals: Week 1:  PT Short Term Goal 1 (Week 1): STG=LTG due to short ELOS.  Skilled Therapeutic Interventions/Progress Updates:     Session 1: Patient in bed upon PT arrival. Patient alert and agreeable to PT session. Patient reported 7/10 R knee pain during session, RN provided pain meds prior to session. PT provided repositioning, rest breaks, and distraction as pain interventions throughout session. Focused session on descending stairs and d/c planning.   Patient adjusted R Bledsoe brace without cues with brace locked in extension at 0 degrees and had R ASO donned prior to session.   Therapeutic Activity: Bed Mobility: Patient performed supine to/from sit with independently in a flat hospital bed without use of bed rails.  Transfers: Patient performed sit to/from stand from the hospital bed, w/c, and standard chairs with mod I using a RW while maintaining TTWB precautions without cues.   Gait Training:  Patient ambulated in non-skid socks in the room using a RW with mod I. Ambulated with hop-to gait pattern on L with TTWB/NWB on the R throughout without cues.  Patient descended 22 steps (2 flights in the stair-well) using R rail and a crutch with min A-CGA. Performed hop-to gait pattern on L. Provided cues for technique and sequencing. Patient was provided a seated rest break at the landing between flights due to moderate SOB, HR 145 bpm. She ambulated on the landing using a RW, as above.  Wheelchair Mobility:  Patient managed leg rests with intermittent assist for finding release latch, otherwise supervision throughout session. She independently locked and unlocked breaks appropriately throughout session for safety.   Therapeutic  Exercise: Patient performed the following exercises with verbal and tactile cues for proper technique. Propelled the w/c 231 feet x2 with mod I for UE strengthening and endurance. Mild SOB after each trial, HR 130s.   Patient in bed with Bledsoe brace removed and ice packs applied to her R knee due to increased soreness following stair training at end of session with all needs within reach. Educated patient on maintaining knee extension with the brace off for no more than 20 min to allow ice to address knee pain and allow her skin to breath to maintain skin integrity. The room was set up for patient to get to/from her w/c independently with RW and leg rests in reach. Patient made independent in the room as she demonstrated safe mobility in the room while maintaining weight bearing precautions, RN made aware.  Session 2: Patient on [facility] upon PT arrival. Patient alert and agreeable to PT session. Patient reported 8/10 R knee pain during session, RN provided pain meds prior to session. PT provided repositioning, rest breaks, and distraction as pain interventions throughout session. Patient was continent of bladder on BSC and was mod I with peri-care and LB dressing using the RW and maintaining weight bearing precautions.   Therapeutic Activity: Bed Mobility: Patient performed supine to/from sit with mod I in the ADL bed with a pillow on top to elevate the bed height to simulate patient's home set-up. Demonstrated use of a step stool to push herself onto the bed in sitting, patient was able to replicate technique following demonstration.  Transfers: Patient performed sit to/from stand from the hospital bed, w/c, standard chair without arm  rests, BSC, and the ADL bed with mod I using a RW while maintaining TTWB precautions without cues.   Gait Training:  Patient ambulated 72 feet using RW with supervision for safety due to decreased endurance. Ambulated with hop-to gait pattern on L. Provided verbal cues for  shoulder depression and posterior hip motion to promote improved L foot clearance with fatigue and decreased impact at initial contact.  Wheelchair Mobility:  Patient propelled wheelchair 125 feet with mod I. Provided verbal cues for leg rest management with supervision.  Patient in bed at end of session with breaks locked and all needs within reach. Discussed d/c planing and set up family education for stair training for tomorrow afternoon with her mother during session.  Educated patient on fall risk/prevention, home modifications to prevent falls, and activation of emergency services in the event of a fall during session.   Therapy Documentation Precautions:  Precautions Precautions: Fall, Knee Precaution Comments: R knee pain, back TP L1-2 fxs, back precautions for comfort Required Braces or Orthoses: Other Brace Other Brace: Bledsoe brace locked in extension R LE, ASO R ankle (also has camboot) Restrictions Weight Bearing Restrictions: Yes RLE Weight Bearing: Touchdown weight bearing Other Position/Activity Restrictions: TDWB following knee repair 10/09/19    Therapy/Group: Individual Therapy  Angelis Gates L Jacquelyn Shadrick PT, DPT  10/22/2019, 12:45 PM
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
## 2019-10-22 NOTE — Progress Notes (Signed)
Orthopedic Tech Progress Note Patient Details:  Jacqueline Erickson August 10, 1991 [id number]  Ortho Devices Type of Ortho Device: Crutches Ortho Device/Splint Interventions: Application   Post Interventions Patient Tolerated: Well Instructions Provided: Care of device   Saul Fordyce 10/22/2019, 4:21 PM

## 2019-10-23 ENCOUNTER — Inpatient Hospital Stay (HOSPITAL_COMMUNITY): Payer: 59 | Admitting: Occupational Therapy

## 2019-10-23 ENCOUNTER — Ambulatory Visit (HOSPITAL_COMMUNITY): Payer: 59

## 2019-10-23 ENCOUNTER — Inpatient Hospital Stay (HOSPITAL_COMMUNITY): Payer: 59

## 2019-10-23 LAB — URINALYSIS, ROUTINE W REFLEX MICROSCOPIC
Bilirubin Urine: NEGATIVE
Glucose, UA: NEGATIVE mg/dL
Hgb urine dipstick: NEGATIVE
Ketones, ur: NEGATIVE mg/dL
Leukocytes,Ua: NEGATIVE
Nitrite: NEGATIVE
Protein, ur: NEGATIVE mg/dL
Specific Gravity, Urine: 1.018 (ref 1.005–1.030)
pH: 5 (ref 5.0–8.0)

## 2019-10-23 MED ORDER — POLYETHYLENE GLYCOL 3350 17 G PO PACK: 17.0000 g | PACK | Freq: Two times a day (BID) | ORAL | 0 refills | Status: DC

## 2019-10-23 MED ORDER — FOLIC ACID 1 MG PO TABS: 1.0000 mg | ORAL_TABLET | Freq: Every day | ORAL | 0 refills | Status: DC

## 2019-10-23 MED ORDER — TRAMADOL HCL 50 MG PO TABS: 100.0000 mg | ORAL_TABLET | Freq: Three times a day (TID) | ORAL | 0 refills | Status: DC

## 2019-10-23 MED ORDER — ASCORBIC ACID 500 MG PO TABS: 500.0000 mg | ORAL_TABLET | Freq: Every day | ORAL | 0 refills | Status: DC

## 2019-10-23 MED ORDER — FERROUS SULFATE 325 (65 FE) MG PO TABS: 325.0000 mg | ORAL_TABLET | Freq: Two times a day (BID) | ORAL | 3 refills | Status: DC

## 2019-10-23 MED ORDER — CYCLOBENZAPRINE HCL 10 MG PO TABS: 10.0000 mg | ORAL_TABLET | Freq: Three times a day (TID) | ORAL | 0 refills | Status: DC

## 2019-10-23 MED ORDER — ADULT MULTIVITAMIN W/MINERALS CH: 1.0000 | ORAL_TABLET | Freq: Every day | ORAL | Status: DC

## 2019-10-23 MED ORDER — METOPROLOL TARTRATE 25 MG PO TABS: 12.5000 mg | ORAL_TABLET | Freq: Two times a day (BID) | ORAL | 1 refills | Status: DC

## 2019-10-23 MED ORDER — GABAPENTIN 100 MG PO CAPS: 500.0000 mg | ORAL_CAPSULE | Freq: Three times a day (TID) | ORAL | 1 refills | Status: DC

## 2019-10-23 MED ORDER — ACETAMINOPHEN 325 MG PO TABS: 650.0000 mg | ORAL_TABLET | Freq: Four times a day (QID) | ORAL | Status: DC | PRN

## 2019-10-23 MED ORDER — OXYCODONE HCL 5 MG PO TABS: 10.0000 mg | ORAL_TABLET | Freq: Three times a day (TID) | ORAL | 0 refills | Status: AC | PRN

## 2019-10-23 NOTE — Progress Notes (Signed)
Social Work Patient ID: Jacqueline Erickson, female   DOB: 1991/07/07, 29 y.o.   MRN: [id number]    SW followed up with [employer] [phone]) about w/c delivery and copays; report there will be follow-up from branch as call was routed to call center.   SW spoke with [place] [phone]) to confirm pt discharge today. Will need orders faxed for HHPT/OT.   SW provided pt with handicap placard. Pt mother and brother in for family edu today at 1:45pm. Pt aware SW working on DME delivery. Pt has crutches delivered to pt room (ordered through [employer]).   *SW received phone call from Ty/[employer] indicating they are not able to provide a speciality wheelchair. Reports pt would not have width or elevating leg rests. SW requested to put order on hold. *SW spoke with Amanda/[employer] to withdraw order.   SW sent new order to [facility] (P: [phone]; f: [phone]). Reports pt will receive w/c Monday or Tuesday; pt will likely have high co-pay for w/c. SW discussed above with pt.  *[place] reports pt insurance is out of network.  SW sent order to [facility] (p:[phone]/f:[phone]). SW spoke with Ashley/[facility]. Reports order is being processed and will call once order is ready and with cost of wheelchair. SW met with pt to provide above updates.   Loralee Pacas, MSW, [place] Office: [phone] Cell: [phone] Fax: [phone]
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
## 2019-10-23 NOTE — Progress Notes (Signed)
Occupational Therapy Discharge Summary  Patient Details  Name: Jacqueline Erickson MRN: [id number] Date of Birth: [birth year]  Today's Date: 10/23/2019 OT Individual Time: 0900-1000 OT Individual Time Calculation (min): 60 min   1:1 Self care retraining at shower level. Pt able to navigate around room with RW mod I. Pt's right Le double wrapped in prep for shower. Pt able to doff clothing and transition into shower stall with zero entry mod I . Propped LE up on stool for comfort in shower. Pt able to shower mod I in seated position with lateral leans for peri area and buttocks. Pt ambulated out the recliner and dressed sit to stand in the recliner with mod I with extra time. Brace did remain dry in shower.  Pt left resting in the recliner with ice applied to knee.   Patient has met 11 of 11 long term goals due to improved activity tolerance, improved balance, ability to compensate for deficits, improved coordination and improvement in pain.  Patient to discharge at overall Modified Independent level.  Patient's care partner is independent to provide the necessary physical and and IADLs as needed assistance at discharge.    Reasons goals not met: n/a   Recommendation:  No f/u needed at this time  Equipment: pt ubtained a tub bench on her own  Reasons for discharge: treatment goals met and discharge from hospital  Patient/family agrees with progress made and goals achieved: Yes  OT Discharge Precautions/Restrictions  Precautions Precautions: Fall;Knee Precaution Comments: R knee pain, back TP L1-2 fxs, back precautions for comfort Required Braces or Orthoses: Other Brace Other Brace: Bledsoe brace locked in extension R LE, ASO R ankle (also has camboot) Restrictions Weight Bearing Restrictions: Yes RLE Weight Bearing: Touchdown weight bearing Other Position/Activity Restrictions: TDWB following knee repair 10/09/19 Pain Pain Assessment Pain Scale: 0-10 Pain Score: 9  Pain Type:  Acute pain Pain Location: Knee Pain Orientation: Right Pain Radiating Towards: back Pain Descriptors / Indicators: Discomfort Pain Frequency: Constant Pain Onset: On-going Patients Stated Pain Goal: 2 Pain Intervention(s): Medication (See eMAR) ADL ADL Eating: Independent Where Assessed-Eating: Chair Grooming: Independent Where Assessed-Grooming: Sitting at sink, Chair Upper Body Bathing: Independent Where Assessed-Upper Body Bathing: Shower Lower Body Bathing: Setup Where Assessed-Lower Body Bathing: Shower Upper Body Dressing: Modified independent (Device) Where Assessed-Upper Body Dressing: Chair Lower Body Dressing: Modified independent Where Assessed-Lower Body Dressing: Chair Toileting: Modified independent Where Assessed-Toileting: Bedside Commode Toilet Transfer: Modified independent Toilet Transfer Method: Ambulating Tub/Shower Transfer: Metallurgist Method: Optometrist: Radio broadcast assistant Vision Baseline Vision/History: Wears glasses Wears Glasses: Distance only Eye Alignment: Within Functional Limits Ocular Range of Motion: Within Functional Limits Tracking/Visual Pursuits: Able to track stimulus in all quads without difficulty Saccades: Within functional limits Additional Comments: reports diploia is significantly better than initially Perception  Perception: Within Functional Limits Praxis Praxis: Intact Cognition Overall Cognitive Status: Within Functional Limits for tasks assessed Arousal/Alertness: Awake/alert Orientation Level: Oriented X4 Attention: Selective Focused Attention: Appears intact Sustained Attention: Appears intact Selective Attention: Appears intact Memory: Appears intact Immediate Memory Recall: Sock;Blue;Bed Memory Recall Sock: Without Cue Memory Recall Blue: Without Cue Memory Recall Bed: Without Cue Awareness: Appears intact Problem Solving: Appears intact Safety/Judgment: Appears  intact Sensation Sensation Light Touch: Impaired Detail Peripheral sensation comments: Patient reports numbness above and below her R patella, otherwise sensation appears intact; at the sx sites Light Touch Impaired Details: Impaired RLE Proprioception: (unable to assess R LE due to pain and ROM restrictions) Coordination Gross Motor Movements  are Fluid and Coordinated: No Fine Motor Movements are Fluid and Coordinated: Yes Coordination and Movement Description: Patient with multiple R knee injuries with surgical repair with R Bledsoe brace locked in extension and TTWB Motor  Motor Motor: Within Functional Limits Motor - Discharge Observations: improved overall strength and endurance. Pt still with elevated Hr with stairs but comes down with rest Mobility  Bed Mobility Rolling Right: Independent Rolling Left: Independent Supine to Sit: Independent with assistive device Sit to Supine: Independent with assistive device Transfers Sit to Stand: Independent with assistive device Stand to Sit: Independent with assistive device  Trunk/Postural Assessment  Cervical Assessment Cervical Assessment: Within Functional Limits Thoracic Assessment Thoracic Assessment: Within Functional Limits Lumbar Assessment Lumbar Assessment: Within Functional Limits Postural Control Postural Control: Within Functional Limits  Balance Static Sitting Balance Static Sitting - Balance Support: No upper extremity supported;Feet supported Static Sitting - Level of Assistance: 7: Independent Dynamic Sitting Balance Dynamic Sitting - Balance Support: During functional activity;Right upper extremity supported;Left upper extremity supported Dynamic Sitting - Level of Assistance: 6: Modified independent (Device/Increase time) Static Standing Balance Static Standing - Level of Assistance: 6: Modified independent (Device/Increase time) Dynamic Standing Balance Dynamic Standing - Balance Support: Bilateral upper  extremity supported;During functional activity Dynamic Standing - Level of Assistance: 6: Modified independent (Device/Increase time) Extremity/Trunk Assessment RUE Assessment RUE Assessment: Within Functional Limits LUE Assessment LUE Assessment: Within Functional Limits   [name] 10/23/2019, 9:34 AM

## 2019-10-23 NOTE — Discharge Instructions (Signed)
Inpatient Rehab Discharge Instructions  Jacqueline Erickson Discharge date and time: No discharge date for patient encounter.   Activities/Precautions/ Functional Status: Activity: Touchdown weightbearing right lower extremity with Bledsoe brace locked in extension Diet: regular diet Wound Care: keep wound clean and dry Functional status:  ___ No restrictions     ___ Walk up steps independently ___ 24/7 supervision/assistance   ___ Walk up steps with assistance ___ Intermittent supervision/assistance  ___ Bathe/dress independently ___ Walk with walker     _x__ Bathe/dress with assistance ___ Walk Independently    ___ Shower independently ___ Walk with assistance    ___ Shower with assistance ___ No alcohol     ___ Return to work/school ________   COMMUNITY REFERRALS UPON DISCHARGE:    Home Health:   PT     OT                     Agency: [facility] Phone: [phone]    Medical Equipment/Items Ordered: wheelchair                                                 Agency/Supplier: [facility] [phone]   Special Instructions: No driving smoking or alcohol   My questions have been answered and I understand these instructions. I will adhere to these goals and the provided educational materials after my discharge from the hospital.  Patient/Caregiver Signature _______________________________ Date __________  Clinician Signature _______________________________________ Date __________  Please bring this form and your medication list with you to all your follow-up doctor's appointments.
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
## 2019-10-23 NOTE — Progress Notes (Signed)
Patient ID: Jacqueline Erickson, female   DOB: 1991-05-16, 29 y.o.   MRN: [id number]    Diagnosis codes: T07.Lorne Skeens, G25.427C,  W23.762G, B15.176H  Height:       5'3"  Weight:      269.50      Patient suffers from traumatic tear of medial meniscus of right knee which impairs their ability to perform daily activities like bathing and dressing  in the home.  A rolling walker or a cane  will not resolve issue with performing activities of daily living.  A wheelchair will allow patient to safely perform daily activities.

## 2019-10-24 DIAGNOSIS — S83511A Sprain of anterior cruciate ligament of right knee, initial encounter: Secondary | ICD-10-CM

## 2019-10-24 DIAGNOSIS — S83521S Sprain of posterior cruciate ligament of right knee, sequela: Secondary | ICD-10-CM

## 2019-10-24 DIAGNOSIS — S83241A Other tear of medial meniscus, current injury, right knee, initial encounter: Secondary | ICD-10-CM

## 2019-10-24 LAB — URINE CULTURE
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
## 2019-10-24 NOTE — Progress Notes (Signed)
Patient discharged off of unit with all belongings. Patient and family have no further questions at time of discharge. No complications noted at this time.  [name] L Reola Buckles  

## 2019-10-24 NOTE — Plan of Care (Signed)
  Problem: Consults Goal: RH GENERAL PATIENT EDUCATION Description: See Patient Education module for education specifics. Outcome: Completed/Met   Problem: RH SKIN INTEGRITY Goal: RH STG SKIN FREE OF INFECTION/BREAKDOWN Description: Skin remains free of infection and break down, MOD I Outcome: Completed/Met   Problem: RH PAIN MANAGEMENT Goal: RH STG PAIN MANAGED AT OR BELOW PT'S PAIN GOAL Description: Goal <4 Outcome: Completed/Met   Problem: RH BOWEL ELIMINATION Goal: RH STG MANAGE BOWEL WITH ASSISTANCE Description: STG Manage Bowel with  min Assistance. Outcome: Completed/Met Goal: RH STG MANAGE BOWEL W/MEDICATION W/ASSISTANCE Description: STG Manage Bowel with Medication with min Assistance. Outcome: Completed/Met

## 2019-10-26 ENCOUNTER — Telehealth: Payer: Self-pay

## 2019-10-26 NOTE — Progress Notes (Signed)
Social Work Discharge Note   The overall goal for the admission was met for:   Discharge location: Yes. Discharge to home  Length of Stay: Yes. 5 days  Discharge activity level: Yes. Mod Asst  Home/community participation: Yes. Limited  Services provided included: MD, RD, PT, OT, SLP, RN, CM, TR, Pharmacy, Neuropsych and SW  Financial Services: Private Insurance: [place]   Follow-up services arranged: Home Health: [facility] [phone] and DME: Wheelchair- [facility] [phone]; [facility]: [facility]W and [facility]   HH- PT/OT with [facility]  Comments (or additional information): Pleas contact pt for further follow-up #[phone]  Patient/Family verbalized understanding of follow-up arrangements: Yes  Individual responsible for coordination of the follow-up plan: Pt to coordinate all care needs.   Confirmed correct DME delivered: Rana Snare 10/26/2019    Rana Snare

## 2019-10-26 NOTE — Telephone Encounter (Signed)
Phone calls received over the weekend:  2/27pm- Dondra Spry with [facility] Clinical Support Team [phone]); Ref ID #[id number]. Stating pt was approved 2/22-2/28 and request for extension was in process.  2/28- Representative from [facility] Clinical Support Team requesting return phone call.  10/26/2019- SW spoke with Alphonso with [facility] Clinical support team to follow-up about request for return phone call and informing pt d/c on 2/27. Reports will inform nursing team to determine if d/c date will be adjusted since pt was approved through 10/26/2019. No further questions/concerns reported.   *SW spoke with Jessica/[facility] [phone]) to inform on CMN faxed back and ask about DME delivery today. SW informed pt w/c to be delivered today. SW called pt ([phone]) to inform on above.

## 2019-10-28 ENCOUNTER — Encounter: Payer: Self-pay | Admitting: Adult Health

## 2019-10-28 ENCOUNTER — Other Ambulatory Visit: Payer: Self-pay

## 2019-10-28 ENCOUNTER — Telehealth (INDEPENDENT_AMBULATORY_CARE_PROVIDER_SITE_OTHER): Payer: 59 | Admitting: Adult Health

## 2019-10-28 DIAGNOSIS — S83521S Sprain of posterior cruciate ligament of right knee, sequela: Secondary | ICD-10-CM | POA: Diagnosis not present

## 2019-10-28 DIAGNOSIS — S83511A Sprain of anterior cruciate ligament of right knee, initial encounter: Secondary | ICD-10-CM

## 2019-10-28 DIAGNOSIS — T07XXXA Unspecified multiple injuries, initial encounter: Secondary | ICD-10-CM | POA: Diagnosis not present

## 2019-10-28 DIAGNOSIS — S83241D Other tear of medial meniscus, current injury, right knee, subsequent encounter: Secondary | ICD-10-CM
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
## 2019-10-31 ENCOUNTER — Encounter: Payer: Self-pay | Admitting: Adult Health

## 2019-11-03 ENCOUNTER — Encounter: Payer: Self-pay | Admitting: Family Medicine

## 2020-03-22 ENCOUNTER — Other Ambulatory Visit: Payer: Self-pay

## 2020-03-23 ENCOUNTER — Ambulatory Visit: Payer: 59 | Admitting: Adult Health

## 2020-03-23 DIAGNOSIS — Z0289 Encounter for other administrative examinations: Secondary | ICD-10-CM

## 2020-06-06 ENCOUNTER — Other Ambulatory Visit: Payer: 59

## 2020-07-12 ENCOUNTER — Ambulatory Visit (HOSPITAL_COMMUNITY)
Admission: AD | Admit: 2020-07-12 | Discharge: 2020-07-12 | Disposition: A | Discharge status: Home / Self Care | Payer: 59 | Attending: Psychiatry | Admitting: Psychiatry

## 2020-07-12 DIAGNOSIS — G479 Sleep disorder, unspecified: Secondary | ICD-10-CM | POA: Insufficient documentation

## 2020-07-12 DIAGNOSIS — F332 Major depressive disorder, recurrent severe without psychotic features: Secondary | ICD-10-CM | POA: Insufficient documentation

## 2020-07-12 DIAGNOSIS — F341 Dysthymic disorder: Secondary | ICD-10-CM | POA: Diagnosis not present

## 2020-07-12 DIAGNOSIS — F4310 Post-traumatic stress disorder, unspecified: Secondary | ICD-10-CM | POA: Diagnosis not present

## 2020-07-12 DIAGNOSIS — F419 Anxiety disorder, unspecified: Secondary | ICD-10-CM | POA: Insufficient documentation

## 2020-07-12 DIAGNOSIS — R450 Nervousness: Secondary | ICD-10-CM | POA: Insufficient documentation

## 2020-07-12 MED ORDER — TRAZODONE HCL 50 MG PO TABS: 50.0000 mg | ORAL_TABLET | Freq: Every day | ORAL | 0 refills | Status: DC
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
## 2020-12-29 ENCOUNTER — Encounter: Payer: Self-pay | Admitting: Adult Health

## 2020-12-29 ENCOUNTER — Ambulatory Visit (INDEPENDENT_AMBULATORY_CARE_PROVIDER_SITE_OTHER): Payer: 59 | Admitting: Adult Health

## 2020-12-29 ENCOUNTER — Other Ambulatory Visit: Payer: Self-pay

## 2020-12-29 VITALS — HR 82 | Temp 98.2°F | Ht 63.0 in | Wt 222.0 lb

## 2020-12-29 DIAGNOSIS — R4789 Other speech disturbances: Secondary | ICD-10-CM

## 2020-12-29 DIAGNOSIS — H539 Unspecified visual disturbance: Secondary | ICD-10-CM

## 2020-12-29 DIAGNOSIS — Z0000 Encounter for general adult medical examination without abnormal findings: Secondary | ICD-10-CM

## 2020-12-29 DIAGNOSIS — F5101 Primary insomnia: Secondary | ICD-10-CM | POA: Diagnosis not present

## 2020-12-29 DIAGNOSIS — F329 Major depressive disorder, single episode, unspecified: Secondary | ICD-10-CM | POA: Diagnosis not present

## 2020-12-29 LAB — LIPID PANEL
Cholesterol: 210 mg/dL — ABNORMAL HIGH (ref 0–200)
HDL: 55 mg/dL (ref >39.00)
LDL Cholesterol: 136 mg/dL — ABNORMAL HIGH (ref 0–99)
NonHDL: 155.32
Total CHOL/HDL Ratio: 4
Triglycerides: 97 mg/dL (ref 0.0–149.0)
VLDL: 19.4 mg/dL (ref 0.0–40.0)

## 2020-12-29 LAB — CBC WITH DIFFERENTIAL/PLATELET
Basophils Absolute: 0 10*3/uL (ref 0.0–0.1)
Basophils Relative: 0.3 % (ref 0.0–3.0)
Eosinophils Absolute: 0.1 10*3/uL (ref 0.0–0.7)
Eosinophils Relative: 1.3 % (ref 0.0–5.0)
HCT: 44.2 % (ref 36.0–46.0)
Hemoglobin: 14.8 g/dL (ref 12.0–15.0)
Lymphocytes Relative: 21.2 % (ref 12.0–46.0)
Lymphs Abs: 2.1 10*3/uL (ref 0.7–4.0)
MCHC: 33.6 g/dL (ref 30.0–36.0)
MCV: 95.3 fl (ref 78.0–100.0)
Monocytes Absolute: 0.7 10*3/uL (ref 0.1–1.0)
Monocytes Relative: 7 % (ref 3.0–12.0)
Neutro Abs: 7 10*3/uL (ref 1.4–7.7)
Neutrophils Relative %: 70.2 % (ref 43.0–77.0)
Platelets: 295 10*3/uL (ref 150.0–400.0)
RBC: 4.64 Mil/uL (ref 3.87–5.11)
RDW: 13.6 % (ref 11.5–15.5)
WBC: 10 10*3/uL (ref 4.0–10.5)

## 2020-12-29 LAB — COMPREHENSIVE METABOLIC PANEL
ALT: 10 U/L (ref 0–35)
AST: 12 U/L (ref 0–37)
Albumin: 4.3 g/dL (ref 3.5–5.2)
Alkaline Phosphatase: 77 U/L (ref 39–117)
BUN: 11 mg/dL (ref 6–23)
CO2: 27 mEq/L (ref 19–32)
Calcium: 9.6 mg/dL (ref 8.4–10.5)
Chloride: 103 mEq/L (ref 96–112)
Creatinine, Ser: 0.78 mg/dL (ref 0.40–1.20)
GFR: 104.08 mL/min (ref >60.00)
Glucose, Bld: 93 mg/dL (ref 70–99)
Potassium: 4.2 mEq/L (ref 3.5–5.1)
Sodium: 138 mEq/L (ref 135–145)
Total Bilirubin: 0.8 mg/dL (ref 0.2–1.2)
Total Protein: 7.2 g/dL (ref 6.0–8.3)

## 2020-12-29 LAB — HEMOGLOBIN A1C: Hgb A1c MFr Bld: 5.7 % (ref 4.6–6.5)

## 2020-12-29 LAB — TSH: TSH: 1.4 u[IU]/mL (ref 0.35–4.50)

## 2020-12-29 MED ORDER — TRAZODONE HCL 100 MG PO TABS: 100.0000 mg | ORAL_TABLET | Freq: Every day | ORAL | 0 refills | Status: DC
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
## 2021-01-14 ENCOUNTER — Ambulatory Visit
Admission: RE | Admit: 2021-01-14 | Discharge: 2021-01-14 | Disposition: A | Discharge status: Home / Self Care | Payer: 59 | Source: Ambulatory Visit | Attending: Adult Health | Admitting: Adult Health

## 2021-01-14 DIAGNOSIS — H539 Unspecified visual disturbance: Secondary | ICD-10-CM

## 2021-01-14 DIAGNOSIS — R4789 Other speech disturbances: Secondary | ICD-10-CM

## 2021-01-17 ENCOUNTER — Telehealth: Payer: Self-pay | Admitting: Adult Health

## 2021-01-17 DIAGNOSIS — R930 Abnormal findings on diagnostic imaging of skull and head, not elsewhere classified: Secondary | ICD-10-CM

## 2021-01-17 NOTE — Telephone Encounter (Signed)
Spoke to patient about MRI and being concerned about multiple sclerosis.  Will refer to neurology for further evaluation

## 2021-01-17 NOTE — Telephone Encounter (Signed)
Left VM to call back to discuss MRI results

## 2021-01-18 ENCOUNTER — Other Ambulatory Visit: Payer: Self-pay

## 2021-01-18 ENCOUNTER — Ambulatory Visit (INDEPENDENT_AMBULATORY_CARE_PROVIDER_SITE_OTHER): Payer: 59 | Admitting: Neurology

## 2021-01-18 ENCOUNTER — Encounter: Payer: Self-pay | Admitting: Neurology

## 2021-01-18 VITALS — BP 138/90 | HR 88 | Resp 20 | Ht 62.0 in | Wt 227.0 lb

## 2021-01-18 DIAGNOSIS — F1020 Alcohol dependence, uncomplicated: Secondary | ICD-10-CM

## 2021-01-18 DIAGNOSIS — G35 Multiple sclerosis: Secondary | ICD-10-CM | POA: Diagnosis not present

## 2021-01-18 NOTE — Patient Instructions (Addendum)
MRI cervical spine with and without contrast  MRI orbit with contrast  Return to clinic in 1 months  We have sent a referral to [facility] for your MRI and they will call you directly to schedule your appointment. They are located at [address] [facility]. If you need to contact them directly please call [phone].
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
## 2021-01-25 ENCOUNTER — Telehealth: Payer: Self-pay | Admitting: Neurology

## 2021-01-25 ENCOUNTER — Other Ambulatory Visit: Payer: 59

## 2021-01-25 NOTE — Telephone Encounter (Signed)
Patient called and left a message that her MRI on 01/31/21 still needs authorized. She'd like a call back once this has been done.

## 2021-01-26 NOTE — Telephone Encounter (Signed)
Patient is going to contact insurance and see why is it denied. Looks like she has to pay up front for the procedure.

## 2021-01-31 ENCOUNTER — Other Ambulatory Visit: Payer: Self-pay

## 2021-01-31 ENCOUNTER — Ambulatory Visit
Admission: RE | Admit: 2021-01-31 | Discharge: 2021-01-31 | Disposition: A | Discharge status: Home / Self Care | Payer: 59 | Source: Ambulatory Visit | Attending: Neurology | Admitting: Neurology

## 2021-01-31 ENCOUNTER — Telehealth: Payer: Self-pay | Admitting: Pharmacy Technician

## 2021-01-31 ENCOUNTER — Telehealth: Payer: Self-pay | Admitting: Neurology

## 2021-01-31 DIAGNOSIS — G35 Multiple sclerosis: Secondary | ICD-10-CM

## 2021-01-31 MED ORDER — GADOBENATE DIMEGLUMINE 529 MG/ML IV SOLN
20.0000 mL | Freq: Once | INTRAVENOUS | Status: AC | PRN
  Administered 2021-01-31: 20 mL via INTRAVENOUS

## 2021-01-31 NOTE — Telephone Encounter (Signed)
Please inform patient that her MRI of the eyes and cervical spine looks good - no signs of MS involving these areas.  Let's proceed with Solumedrol 1g x 3 days, which should help with her visual symptoms.  I have added the orders for [facility] at [facility].

## 2021-01-31 NOTE — Telephone Encounter (Signed)
Dr. Nita Sickle   Auth Submission: NO AUTH REQUIRED/approved Payer: Georgann Housekeeper Medication & CPT/J Code(s) submitted: Solumedrol (Methylprednisolone) J2930 Route of submission (phone, fax, portal): PHONE [phone] Auth type: Buy/Bill Units/visits requested: 3 Reference number: NO PA REQUIRED REF# [id number] BIV REF# [id number]

## 2021-02-01 ENCOUNTER — Telehealth: Payer: Self-pay

## 2021-02-01 ENCOUNTER — Encounter: Payer: Self-pay | Admitting: Adult Health

## 2021-02-01 NOTE — Telephone Encounter (Signed)
Please advise 

## 2021-02-01 NOTE — Telephone Encounter (Signed)
Patient advised of MRI results and treatment options at the infusion center, [phone] . Patient verbally understood and understood plan.

## 2021-02-01 NOTE — Telephone Encounter (Signed)
Patient called an advised again at 71

## 2021-02-01 NOTE — Telephone Encounter (Signed)
Jacqueline Erickson, would you call her back and answer the questions she has. She said she forgot what you said. Thanks

## 2021-02-08 ENCOUNTER — Other Ambulatory Visit: Payer: Self-pay

## 2021-02-08 ENCOUNTER — Ambulatory Visit (INDEPENDENT_AMBULATORY_CARE_PROVIDER_SITE_OTHER): Payer: 59

## 2021-02-08 VITALS — BP 163/107 | HR 79 | Temp 99.1°F | Resp 20 | Ht 63.0 in | Wt 230.4 lb

## 2021-02-08 DIAGNOSIS — G35 Multiple sclerosis: Secondary | ICD-10-CM | POA: Diagnosis not present

## 2021-02-08 MED ORDER — DIPHENHYDRAMINE HCL 50 MG/ML IJ SOLN: 50.0000 mg | Freq: Once | INTRAMUSCULAR | Status: DC | PRN

## 2021-02-08 MED ORDER — HEPARIN SOD (PORK) LOCK FLUSH 100 UNIT/ML IV SOLN: 250.0000 [IU] | Freq: Once | INTRAVENOUS | Status: DC | PRN

## 2021-02-08 MED ORDER — HEPARIN SOD (PORK) LOCK FLUSH 100 UNIT/ML IV SOLN: 500.0000 [IU] | Freq: Once | INTRAVENOUS | Status: DC | PRN

## 2021-02-08 MED ORDER — ALBUTEROL SULFATE HFA 108 (90 BASE) MCG/ACT IN AERS: 2.0000 | INHALATION_SPRAY | Freq: Once | RESPIRATORY_TRACT | Status: DC | PRN

## 2021-02-08 MED ORDER — METHYLPREDNISOLONE SODIUM SUCC 125 MG IJ SOLR: 125.0000 mg | Freq: Once | INTRAMUSCULAR | Status: DC | PRN

## 2021-02-08 MED ORDER — SODIUM CHLORIDE 0.9% FLUSH: 3.0000 mL | Freq: Once | INTRAVENOUS | Status: DC | PRN

## 2021-02-08 MED ORDER — SODIUM CHLORIDE 0.9% FLUSH: 10.0000 mL | Freq: Once | INTRAVENOUS | Status: DC | PRN

## 2021-02-08 MED ORDER — FAMOTIDINE IN NACL 20-0.9 MG/50ML-% IV SOLN: 20.0000 mg | Freq: Once | INTRAVENOUS | Status: DC | PRN

## 2021-02-08 MED ORDER — SODIUM CHLORIDE 0.9 % IV SOLN: Freq: Once | INTRAVENOUS | Status: DC | PRN

## 2021-02-08 MED ORDER — ALTEPLASE 2 MG IJ SOLR: 2.0000 mg | Freq: Once | INTRAMUSCULAR | Status: DC | PRN

## 2021-02-08 MED ORDER — EPINEPHRINE 0.3 MG/0.3ML IJ SOAJ: 0.3000 mg | Freq: Once | INTRAMUSCULAR | Status: DC | PRN

## 2021-02-08 MED ORDER — METHYLPREDNISOLONE SODIUM SUCC 1000 MG IJ SOLR
1000.0000 mg | Freq: Once | INTRAMUSCULAR | Status: AC
  Administered 2021-02-08: 1000 mg via INTRAVENOUS
  Filled 2021-02-08: qty 8

## 2021-02-08 MED ORDER — ANTICOAGULANT SODIUM CITRATE 4% (200MG/5ML) IV SOLN
5.0000 mL | Freq: Once | Status: DC | PRN
  Filled 2021-02-08: qty 5

## 2021-02-08 NOTE — Progress Notes (Signed)
Diagnosis: Multiple Sclerosis  Provider:  Chilton Greathouse, MD  Procedure: Infusion  IV Type: Peripheral, IV Location: L Antecubital  Solumedrol (Methylprednisolone), Dose: 1000 mg  Infusion Start Time: 1104  Infusion Stop Time: 1215  Post Infusion IV Care: Observation period completed and Peripheral IV Discontinued  Discharge: Condition: Good, Destination: Home . AVS provided to patient.   Performed by:  Nat Math, RN

## 2021-02-09 ENCOUNTER — Ambulatory Visit (INDEPENDENT_AMBULATORY_CARE_PROVIDER_SITE_OTHER): Payer: 59

## 2021-02-09 ENCOUNTER — Ambulatory Visit: Payer: 59 | Admitting: Family Medicine

## 2021-02-09 VITALS — BP 142/99 | HR 95 | Temp 98.6°F | Resp 18

## 2021-02-09 DIAGNOSIS — G35 Multiple sclerosis: Secondary | ICD-10-CM

## 2021-02-09 MED ORDER — METHYLPREDNISOLONE SODIUM SUCC 1000 MG IJ SOLR
1000.0000 mg | Freq: Once | INTRAMUSCULAR | Status: AC
  Administered 2021-02-09: 1000 mg via INTRAVENOUS
  Filled 2021-02-09: qty 8

## 2021-02-09 NOTE — Progress Notes (Signed)
Diagnosis: Multiple Sclerosis  Provider:  Chilton Greathouse, MD  Procedure: Infusion  IV Type: Peripheral, IV Location: R Hand  Solumedrol (Methylprednisolone), Dose: 1000 mg  Infusion Start Time: 1046  Infusion Stop Time: 1146  Post Infusion IV Care: Observation period completed  Discharge: Condition: Good, Destination: Home . AVS provided to patient.   Performed by:  Marilynn Rail, RN

## 2021-02-10 ENCOUNTER — Encounter: Payer: Self-pay | Admitting: Family Medicine

## 2021-02-10 ENCOUNTER — Ambulatory Visit (INDEPENDENT_AMBULATORY_CARE_PROVIDER_SITE_OTHER): Payer: 59 | Admitting: Family Medicine

## 2021-02-10 ENCOUNTER — Other Ambulatory Visit: Payer: Self-pay

## 2021-02-10 ENCOUNTER — Ambulatory Visit (INDEPENDENT_AMBULATORY_CARE_PROVIDER_SITE_OTHER): Payer: 59 | Admitting: *Deleted

## 2021-02-10 VITALS — BP 150/90 | HR 91 | Temp 98.3°F | Wt 231.2 lb

## 2021-02-10 VITALS — BP 156/107 | HR 86 | Temp 97.6°F | Resp 20

## 2021-02-10 DIAGNOSIS — G35 Multiple sclerosis: Secondary | ICD-10-CM | POA: Diagnosis not present

## 2021-02-10 DIAGNOSIS — I10 Essential (primary) hypertension: Secondary | ICD-10-CM

## 2021-02-10 MED ORDER — METHYLPREDNISOLONE SODIUM SUCC 1000 MG IJ SOLR
1000.0000 mg | Freq: Once | INTRAMUSCULAR | Status: AC
  Administered 2021-02-10: 1000 mg via INTRAVENOUS
  Filled 2021-02-10: qty 8

## 2021-02-10 MED ORDER — METOPROLOL TARTRATE 25 MG PO TABS: 12.5000 mg | ORAL_TABLET | Freq: Two times a day (BID) | ORAL | 1 refills | Status: DC

## 2021-02-10 NOTE — Progress Notes (Signed)
Diagnosis: Multiple Sclerosis  Provider:  Chilton Greathouse, MD  Procedure: Infusion  IV Type: Peripheral, IV Location: L Hand  Solumedrol (Methylprednisolone), Dose: 1000 mg  Infusion Start Time: 1036  Infusion Stop Time: 1146  Post Infusion IV Care: Observation period completed  Discharge: Condition: Good, Destination: Home . AVS provided to patient.   Performed by:  Forrest Moron, RN
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
## 2021-02-15 ENCOUNTER — Other Ambulatory Visit: Payer: Self-pay | Admitting: Pharmacy Technician

## 2021-02-16 ENCOUNTER — Other Ambulatory Visit: Payer: Self-pay

## 2021-02-16 ENCOUNTER — Telehealth (INDEPENDENT_AMBULATORY_CARE_PROVIDER_SITE_OTHER): Payer: 59 | Admitting: Neurology

## 2021-02-16 VITALS — Ht 62.0 in | Wt 232.0 lb

## 2021-02-16 DIAGNOSIS — G35 Multiple sclerosis: Secondary | ICD-10-CM | POA: Diagnosis not present

## 2021-02-16 NOTE — Progress Notes (Signed)
   Virtual Visit via Video Note The purpose of this virtual visit is to provide medical care while limiting exposure to the novel coronavirus.    Consent was obtained for video visit:  Yes.   Answered questions that patient had about telehealth interaction:  Yes.   I discussed the limitations, risks, security and privacy concerns of performing an evaluation and management service by telemedicine. I also discussed with the patient that there may be a patient responsible charge related to this service. The patient expressed understanding and agreed to proceed.  Pt location: Home Physician Location: office Name of referring provider:  Shirline Frees, NP I connected with Jacqueline Erickson at patients initiation/request on 02/16/2021 at 10:30 AM EDT by video enabled telemedicine application and verified that I am speaking with the correct person using two identifiers. Pt MRN:  [id number] Pt DOB:  1990/12/30 Video Participants:  Jacqueline Erickson   History of Present Illness: This is a 30 y.o. female returning for follow-up of newly diagnosed RRMS.  Her MRI cerivcal spine did not show any cord lesions.  With her MRI brain highly suggestive of demyelinating disease and clinical history, I recommended she undergo steroid infusions.  She developed cold-like symptoms prior to her steroid infusion and was taking medication.  She noticed that during her steroid infusions, her blood pressure was elevated (163/102).  She scheduled a visit with her PCP and was given metoprolol, but she did not start this.  Her energy level has significantly improved following infusion.  She is having a lot of achy pain in the back and shoulder blades. No new neurological symptoms, such as numbness/tingling, or new vision complaints.    Observations/Objective:   Vitals:   02/16/21 0905  Weight: 232 lb (105.2 kg)  Height: 5\' 2"  (1.575 m)   Patient is awake, alert, and appears comfortable.  Oriented x 4.   Extraocular  muscles are intact. No ptosis.  Face is symmetric.  Speech is not dysarthric. Antigravity in all extremities.  No pronator drift.   Assessment and Plan:  Relapsing-remitting multiple sclerosis - new diagnosis based on imaging and clinical presentation.  I discussed management options at length and given the absence of cord involvement, I suggest starting an oral agent for disease modifying therapy.  We will start PA for Vumerity.  Start vitamin D 5000U. She had many questions which I answered to the best of my ability.   Regarding her myalgias, this is most likely associated with URI than infusion or MS.  For her blood pressure, I recommend she monitor and if it remains elevated start metoprolol as prescribed by her PCP.   Follow Up Instructions:   I discussed the assessment and treatment plan with the patient. The patient was provided an opportunity to ask questions and all were answered. The patient agreed with the plan and demonstrated an understanding of the instructions.   The patient was advised to call back or seek an in-person evaluation if the symptoms worsen or if the condition fails to improve as anticipated.  Follow-up in 3 months  Total time spent:  30 minutes     , DO

## 2021-03-01 ENCOUNTER — Telehealth: Payer: Self-pay

## 2021-03-01 NOTE — Telephone Encounter (Signed)
Paperwork started and placed on Dr. Eliane Decree desk for signature.  Called patient to ask her to come in to sign Vumerity paperwork, but unable to leave a message due to mailbox being full.

## 2021-03-01 NOTE — Telephone Encounter (Signed)
-----   Message from Glendale Chard, DO sent at 02/17/2021  3:15 PM EDT ----- Beryl Meager, please start paperwork for Vumerity. Starla, please schedule 3 month follow-up.  Thanks.

## 2021-03-02 NOTE — Telephone Encounter (Signed)
Called patient and asked if she could stop in and sign the Vumerity form. Patient stated that she will come in on Monday to sign. Provided patient with office hours. Patient had no further questions or concerns.   Paperwork placed up front for signature.

## 2021-03-03 ENCOUNTER — Ambulatory Visit: Payer: 59 | Admitting: Adult Health
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
## 2021-04-03 ENCOUNTER — Telehealth: Payer: Self-pay | Admitting: Neurology

## 2021-04-03 NOTE — Telephone Encounter (Signed)
Pt called stating the pharmacy told her they need another PA for vumerity.

## 2021-04-03 NOTE — Telephone Encounter (Signed)
Printed out telephone encounter and placed in "Pa to do folder"

## 2021-04-04 ENCOUNTER — Telehealth: Payer: Self-pay | Admitting: Neurology

## 2021-04-04 NOTE — Telephone Encounter (Signed)
Jacqueline Erickson with [facility] called and left a message with Access Nurse. They need a prior authorization for the pt's Vumerity. They key code for access via cover my meds is [id number].

## 2021-04-04 NOTE — Telephone Encounter (Signed)
Jacqueline Erickson (Key: [id number]) - [id number] VUMERITY 231MG  delayed-release capsules     Status: PA Request  Created: August 5th, 2022 ([phone]) [phone]  Sent: August 9th, 2022  Open  Archive

## 2021-04-07 ENCOUNTER — Other Ambulatory Visit: Payer: Self-pay | Admitting: Adult Health

## 2021-04-07 DIAGNOSIS — F5101 Primary insomnia: Secondary | ICD-10-CM

## 2021-04-11 NOTE — Telephone Encounter (Signed)
Misty from [employer] called and said the [employer] company is requesting more information for the Vumerity PA.

## 2021-04-20 NOTE — Telephone Encounter (Signed)
Misty from [employer] called and said the PA for Vumerity was cancelled by the providers.   She'd like to know what the next step is? Is there another medication?

## 2021-04-21 NOTE — Telephone Encounter (Signed)
Kaylen Caperton (Key: [id number]) [phone] VUMERITY 231MG  delayed-release capsules     Status: PA Request  Created: August 26th, 2022  Sent: August 26th, 2022

## 2021-04-24 NOTE — Telephone Encounter (Signed)
F/u  Letter from [employer] -  [facility]  Additional information needed by 06/05/2021 Vumerity DR 231 mg Capsule.

## 2021-04-25 ENCOUNTER — Telehealth: Payer: Self-pay | Admitting: Neurology

## 2021-04-25 NOTE — Telephone Encounter (Signed)
Patient states that she has been trying for a couple of months now to get the medication Vumerity    She would like to know why it has not been approved if it insurance or pharmacy or our office. She states that she is getting worse please call

## 2021-05-05 ENCOUNTER — Telehealth: Payer: Self-pay

## 2021-05-05 NOTE — Telephone Encounter (Signed)
Patients insurance has denied Vumerity DR 231 MG Capsule. Please start PA for dimethyl fumerate.

## 2021-05-08 NOTE — Telephone Encounter (Signed)
Jacora Nisley (Key: [id number]) VUMERITY 231MG  delayed-release capsules   Form [employer] Prior Authorization Request Form  Plan Contact [phone] phone [phone] fax Created 27 minutes ago Sent to Plan 3 minutes ago Determination Wait for Determination Please wait for [employer] 2017 to return a determination.

## 2021-05-08 NOTE — Telephone Encounter (Signed)
F/u   Website: Cover my meds   Resubmit prior authorization on Vumerity 231 mg for generic Dimethyl Fumerate.

## 2021-05-22 ENCOUNTER — Ambulatory Visit (INDEPENDENT_AMBULATORY_CARE_PROVIDER_SITE_OTHER): Payer: 59 | Admitting: Neurology

## 2021-05-22 ENCOUNTER — Other Ambulatory Visit: Payer: Self-pay

## 2021-05-22 ENCOUNTER — Encounter: Payer: Self-pay | Admitting: Neurology

## 2021-05-22 VITALS — BP 151/117 | HR 85 | Ht 62.0 in | Wt 238.0 lb

## 2021-05-22 DIAGNOSIS — G35 Multiple sclerosis: Secondary | ICD-10-CM

## 2021-05-22 DIAGNOSIS — R030 Elevated blood-pressure reading, without diagnosis of hypertension: Secondary | ICD-10-CM
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
## 2021-05-22 NOTE — Patient Instructions (Addendum)
Start vitamin D 5000 IU  Recommend you get annual eye exam  We will follow-up on your medication status  Start your blood pressure medication  Return to clinic in 6 months

## 2021-07-06 ENCOUNTER — Telehealth: Payer: Self-pay

## 2021-07-06 NOTE — Telephone Encounter (Signed)
New message   Your information has been sent to [employer].  Jacqueline Erickson (Key: [id number]) VUMERITY 231MG  delayed-release capsules   Form [employer] ePA Form 2017 NCPDP Created 5 minutes ago Sent to Plan 2 minutes ago Plan Response 2 minutes ago Submit Clinical Questions less than a minute ago Determination Wait for Determination Please wait for [employer] 2017 to return a determination.

## 2021-07-07 ENCOUNTER — Telehealth: Payer: Self-pay

## 2021-07-07 MED ORDER — DIMETHYL FUMARATE STARTER PACK 120 & 240 MG PO MISC: ORAL | 5 refills | Status: DC

## 2021-07-07 NOTE — Telephone Encounter (Signed)
Pt preferred medication is dimethyl fumerate, Rx sent.

## 2021-07-13 ENCOUNTER — Telehealth: Payer: Self-pay | Admitting: Neurology

## 2021-07-13 NOTE — Telephone Encounter (Signed)
[facility] called to clarify directions on dimethyl fumarate.

## 2021-07-13 NOTE — Telephone Encounter (Signed)
Dimethyl fumerate take 120mg  twice daily x 1 week, then increase to 240mg  twice daily thereafter.

## 2021-07-14 NOTE — Telephone Encounter (Signed)
Called and spoke to [facility] pharmacy and provided directions per Dr. Allena Katz for patient to take Dimethyl fumerate take 120mg  twice daily x 1 week, then increase to 240mg  twice daily thereafter.   had No further questions or concerns.

## 2021-08-23 ENCOUNTER — Other Ambulatory Visit: Payer: Self-pay | Admitting: Pharmacy Technician

## 2021-10-23 DIAGNOSIS — I10 Essential (primary) hypertension: Secondary | ICD-10-CM | POA: Diagnosis not present

## 2021-10-23 DIAGNOSIS — H6692 Otitis media, unspecified, left ear: Secondary | ICD-10-CM | POA: Diagnosis not present

## 2021-10-31 ENCOUNTER — Other Ambulatory Visit: Payer: Self-pay

## 2021-10-31 ENCOUNTER — Encounter: Payer: Self-pay | Admitting: Family Medicine

## 2021-10-31 ENCOUNTER — Other Ambulatory Visit (HOSPITAL_COMMUNITY)
Admission: RE | Admit: 2021-10-31 | Discharge: 2021-10-31 | Disposition: A | Discharge status: Home / Self Care | Payer: BC Managed Care – PPO | Source: Ambulatory Visit | Attending: Family Medicine | Admitting: Family Medicine

## 2021-10-31 ENCOUNTER — Ambulatory Visit (INDEPENDENT_AMBULATORY_CARE_PROVIDER_SITE_OTHER): Payer: BC Managed Care – PPO | Admitting: Family Medicine

## 2021-10-31 VITALS — BP 142/100 | HR 74 | Ht 62.0 in | Wt 237.0 lb

## 2021-10-31 DIAGNOSIS — I10 Essential (primary) hypertension: Secondary | ICD-10-CM

## 2021-10-31 DIAGNOSIS — Z113 Encounter for screening for infections with a predominantly sexual mode of transmission: Secondary | ICD-10-CM | POA: Diagnosis not present

## 2021-10-31 DIAGNOSIS — Z01411 Encounter for gynecological examination (general) (routine) with abnormal findings: Secondary | ICD-10-CM | POA: Diagnosis not present

## 2021-10-31 DIAGNOSIS — Z0000 Encounter for general adult medical examination without abnormal findings: Secondary | ICD-10-CM

## 2021-10-31 DIAGNOSIS — B3731 Acute candidiasis of vulva and vagina: Secondary | ICD-10-CM | POA: Diagnosis not present

## 2021-10-31 MED ORDER — FLUCONAZOLE 150 MG PO TABS: ORAL_TABLET | ORAL | 0 refills | Status: DC
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
## 2021-10-31 NOTE — Progress Notes (Signed)
Patient presents for AEX. ?Last PAP 02/11/2016 ?Desires STD screen & blood work.  ?Desires Nexplanon removal & reinsertion. ?[id number] ?PHQ9-10 ?

## 2021-11-01 ENCOUNTER — Other Ambulatory Visit: Payer: Self-pay | Admitting: Family Medicine

## 2021-11-01 DIAGNOSIS — B9689 Other specified bacterial agents as the cause of diseases classified elsewhere: Secondary | ICD-10-CM

## 2021-11-01 DIAGNOSIS — N760 Acute vaginitis: Secondary | ICD-10-CM

## 2021-11-01 LAB — CERVICOVAGINAL ANCILLARY ONLY
Bacterial Vaginitis (gardnerella): POSITIVE — AB
Candida Glabrata: NEGATIVE
Candida Vaginitis: POSITIVE — AB
Chlamydia: NEGATIVE
Comment: NEGATIVE
Comment: NEGATIVE
Comment: NEGATIVE
Comment: NEGATIVE
Comment: NEGATIVE
Comment: NORMAL
Neisseria Gonorrhea: NEGATIVE
Trichomonas: NEGATIVE

## 2021-11-01 LAB — HIV ANTIBODY (ROUTINE TESTING W REFLEX): HIV Screen 4th Generation wRfx: NONREACTIVE

## 2021-11-01 LAB — RPR: RPR Ser Ql: NONREACTIVE

## 2021-11-01 LAB — HEPATITIS B SURFACE ANTIGEN: Hepatitis B Surface Ag: NEGATIVE

## 2021-11-01 LAB — HEPATITIS C ANTIBODY: Hep C Virus Ab: NONREACTIVE

## 2021-11-01 LAB — CYTOLOGY - PAP
Adequacy: ABSENT
Diagnosis: NEGATIVE

## 2021-11-01 MED ORDER — METRONIDAZOLE 500 MG PO TABS: 500.0000 mg | ORAL_TABLET | Freq: Two times a day (BID) | ORAL | 0 refills | Status: AC

## 2021-11-20 ENCOUNTER — Telehealth: Payer: Self-pay | Admitting: Adult Health

## 2021-11-20 ENCOUNTER — Encounter: Payer: Self-pay | Admitting: Neurology

## 2021-11-20 ENCOUNTER — Ambulatory Visit: Payer: BC Managed Care – PPO | Admitting: Neurology

## 2021-11-20 DIAGNOSIS — Z029 Encounter for administrative examinations, unspecified: Secondary | ICD-10-CM

## 2021-11-20 NOTE — Telephone Encounter (Signed)
Patient is calling in requesting a referral to another provider/ neurologists because her provider is out on maternity leave. ? ?Please advise. ?

## 2021-11-22 NOTE — Telephone Encounter (Signed)
Tried to call pt - no answer and mailbox is full 

## 2021-11-22 NOTE — Telephone Encounter (Signed)
Spoke to pt and she stated that she may need a referral to switch to another provider in the same practice. I advised pt to ask if this is needed for TOC within the same practice. Pt stated she will call to see if one is needed if so she will let us know.  ?

## 2021-11-23 ENCOUNTER — Telehealth: Payer: Self-pay | Admitting: Neurology

## 2021-11-23 NOTE — Telephone Encounter (Signed)
Please find out what type of accommodations she is requesting and see if she can leave a copy of her forms.  I can review and decide if this is something that can be completed prior to her next visit.   ?

## 2021-11-23 NOTE — Telephone Encounter (Signed)
Patient called wanting to know if she can see anyone else in the office since Dr. Allena Katz is getting ready to go on maternity leave. ? ?She said she is needing accomodation forms completed and needs to be seen before August. ? ?Patient did not keep her appointment on 11/20/21 due to work, she said. ?

## 2021-11-24 NOTE — Telephone Encounter (Signed)
Called patient and left a message for a call back.  

## 2021-11-24 NOTE — Telephone Encounter (Signed)
Patient left a message returning a call to Mahina. 

## 2021-11-24 NOTE — Telephone Encounter (Signed)
Patient called back

## 2021-11-24 NOTE — Telephone Encounter (Signed)
Called patient and ask what type of accommodations is she needing? Patient stated that she is in the insomnia and anxiety state of her MS and it has been affecting her work because she has not been able to sleep. She states she has had absents due to this.Patient states that her job has forms that Dr. Posey Pronto would need to fill out and also stated if Dr. Posey Pronto wanted to include a letter as well that would be helpful. I provided patient with our fax number and informed her we would be on a look out for her forms to come via fax. Patient verbalized understanding and had no further questions or concerns. ?

## 2021-11-28 NOTE — Telephone Encounter (Signed)
Insomnia and anxiety are conditions that she should see her PCP about.     ? ?This is not due to MS exacerbation and therefore would not be able to provide accommodations from a MS standpoint.   ? ?Recommend she follow-up with PCP.  ?

## 2021-11-29 NOTE — Telephone Encounter (Signed)
Sent mychart message

## 2021-11-29 NOTE — Telephone Encounter (Signed)
Called patient and left a message for a call back.  

## 2021-12-04 NOTE — Progress Notes (Deleted)
Pt did not show for her Nexplanon removal/reinsertion appointment.  ? ?Milas Hock, MD, FACOG ?Obstetrician Heritage manager, Faculty Practice ?Center for [employer], [facility] ? ? ? ? ?

## 2021-12-05 ENCOUNTER — Ambulatory Visit: Payer: BC Managed Care – PPO | Admitting: Obstetrics and Gynecology

## 2021-12-05 DIAGNOSIS — Z3046 Encounter for surveillance of implantable subdermal contraceptive: Secondary | ICD-10-CM

## 2022-06-06 ENCOUNTER — Ambulatory Visit
Admission: EM | Admit: 2022-06-06 | Discharge: 2022-06-06 | Disposition: A | Discharge status: Home / Self Care | Payer: PRIVATE HEALTH INSURANCE | Attending: Physician Assistant | Admitting: Physician Assistant

## 2022-06-06 ENCOUNTER — Encounter: Payer: Self-pay | Admitting: Emergency Medicine

## 2022-06-06 DIAGNOSIS — J029 Acute pharyngitis, unspecified: Secondary | ICD-10-CM

## 2022-06-06 DIAGNOSIS — Z1152 Encounter for screening for COVID-19: Secondary | ICD-10-CM | POA: Diagnosis not present

## 2022-06-06 DIAGNOSIS — R112 Nausea with vomiting, unspecified: Secondary | ICD-10-CM

## 2022-06-06 DIAGNOSIS — Z79899 Other long term (current) drug therapy: Secondary | ICD-10-CM | POA: Diagnosis not present

## 2022-06-06 DIAGNOSIS — R051 Acute cough: Secondary | ICD-10-CM

## 2022-06-06 DIAGNOSIS — J069 Acute upper respiratory infection, unspecified: Secondary | ICD-10-CM | POA: Diagnosis present

## 2022-06-06 LAB — RESP PANEL BY RT-PCR (FLU A&B, COVID) ARPGX2
Influenza A by PCR: NEGATIVE
Influenza B by PCR: NEGATIVE
SARS Coronavirus 2 by RT PCR: NEGATIVE

## 2022-06-06 LAB — POCT RAPID STREP A (OFFICE): Rapid Strep A Screen: NEGATIVE

## 2022-06-06 MED ORDER — DM-GUAIFENESIN ER 30-600 MG PO TB12: 1.0000 | ORAL_TABLET | Freq: Two times a day (BID) | ORAL | 0 refills | Status: DC

## 2022-06-06 MED ORDER — ONDANSETRON HCL 4 MG PO TABS: 4.0000 mg | ORAL_TABLET | Freq: Three times a day (TID) | ORAL | 0 refills | Status: DC | PRN

## 2022-06-06 MED ORDER — ONDANSETRON 4 MG PO TBDP
4.0000 mg | ORAL_TABLET | Freq: Once | ORAL | Status: AC
  Administered 2022-06-06: 4 mg via ORAL

## 2022-06-06 MED ORDER — AZITHROMYCIN 250 MG PO TABS: 250.0000 mg | ORAL_TABLET | Freq: Every day | ORAL | 0 refills | Status: DC

## 2022-06-06 NOTE — Discharge Instructions (Signed)
Advised take the Zofran every 6 hours in order to control the nausea and vomiting. Advised take the Mucinex DM in order to control the chest congestion and cough. Advised to take the Zithromax as directed in order to treat infection.  COVID and flu test will be completed in 48-72 hours.  If you do not get a call from this office in that timeframe that indicates the test is negative.  Log onto MyChart to view the test results when they post in 48-72 hours.
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
## 2022-06-06 NOTE — ED Triage Notes (Signed)
Pt is present today with vomiting, chills, sore throat, cough, body aches,and ear pain. Pt sx started x2 days ago

## 2022-08-19 ENCOUNTER — Other Ambulatory Visit: Payer: Self-pay

## 2022-08-19 ENCOUNTER — Emergency Department (HOSPITAL_COMMUNITY): Payer: PRIVATE HEALTH INSURANCE

## 2022-08-19 ENCOUNTER — Emergency Department (HOSPITAL_COMMUNITY)
Admission: EM | Admit: 2022-08-19 | Discharge: 2022-08-19 | Disposition: A | Discharge status: Home / Self Care | Payer: PRIVATE HEALTH INSURANCE | Attending: Internal Medicine | Admitting: Internal Medicine

## 2022-08-19 DIAGNOSIS — R030 Elevated blood-pressure reading, without diagnosis of hypertension: Secondary | ICD-10-CM | POA: Insufficient documentation

## 2022-08-19 DIAGNOSIS — F17210 Nicotine dependence, cigarettes, uncomplicated: Secondary | ICD-10-CM | POA: Diagnosis not present

## 2022-08-19 DIAGNOSIS — M5431 Sciatica, right side: Secondary | ICD-10-CM | POA: Insufficient documentation

## 2022-08-19 LAB — URINALYSIS, ROUTINE W REFLEX MICROSCOPIC
Bilirubin Urine: NEGATIVE
Glucose, UA: NEGATIVE mg/dL
Hgb urine dipstick: NEGATIVE
Ketones, ur: NEGATIVE mg/dL
Leukocytes,Ua: NEGATIVE
Nitrite: NEGATIVE
Protein, ur: NEGATIVE mg/dL
Specific Gravity, Urine: 1.027 (ref 1.005–1.030)
pH: 5 (ref 5.0–8.0)

## 2022-08-19 LAB — PREGNANCY, URINE: Preg Test, Ur: NEGATIVE

## 2022-08-19 MED ORDER — IBUPROFEN 800 MG PO TABS
800.0000 mg | ORAL_TABLET | Freq: Once | ORAL | Status: AC
  Administered 2022-08-19: 800 mg via ORAL
  Filled 2022-08-19: qty 1

## 2022-08-19 MED ORDER — OXYCODONE-ACETAMINOPHEN 5-325 MG PO TABS
1.0000 | ORAL_TABLET | Freq: Once | ORAL | Status: AC
  Administered 2022-08-19: 1 via ORAL
  Filled 2022-08-19: qty 1

## 2022-08-19 MED ORDER — OXYCODONE-ACETAMINOPHEN 5-325 MG PO TABS: 1.0000 | ORAL_TABLET | ORAL | 0 refills | Status: DC | PRN
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
## 2022-08-19 NOTE — ED Notes (Signed)
Patient ambulatory to restroom. Steady gait noted.
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
## 2022-08-19 NOTE — ED Notes (Signed)
Patient educated about not driving or performing other critical tasks (such as operating heavy machinery, caring for infant/toddler/child) due to sedative nature of narcotic medications received while in the ED.  Pt/caregiver verbalized understanding.   

## 2022-08-23 ENCOUNTER — Encounter (HOSPITAL_COMMUNITY): Payer: Self-pay

## 2022-08-23 ENCOUNTER — Other Ambulatory Visit: Payer: Self-pay

## 2022-08-23 ENCOUNTER — Emergency Department (HOSPITAL_COMMUNITY)
Admission: EM | Admit: 2022-08-23 | Discharge: 2022-08-24 | Discharge status: Against Medical Advice | Payer: PRIVATE HEALTH INSURANCE | Attending: Emergency Medicine | Admitting: Emergency Medicine

## 2022-08-23 DIAGNOSIS — Z5321 Procedure and treatment not carried out due to patient leaving prior to being seen by health care provider: Secondary | ICD-10-CM | POA: Diagnosis not present

## 2022-08-23 DIAGNOSIS — M545 Low back pain, unspecified: Secondary | ICD-10-CM | POA: Diagnosis present

## 2022-08-23 NOTE — ED Triage Notes (Signed)
Lower midline back pain that radiates upwards. Pain constant since 12/21. Seen on 12/24 and dx with sciatica. Taking oxycodone with no improvement.   Also sts family covid positive. Denies sx but request being checked .

## 2022-08-25 ENCOUNTER — Telehealth: Payer: PRIVATE HEALTH INSURANCE | Admitting: Emergency Medicine

## 2022-08-25 DIAGNOSIS — M549 Dorsalgia, unspecified: Secondary | ICD-10-CM | POA: Diagnosis not present

## 2022-08-25 NOTE — Patient Instructions (Signed)
  Jacqueline Erickson, thank you for joining Roxy Horseman, PA-C for today's virtual visit.  While this provider is not your primary care provider (PCP), if your PCP is located in our provider database this encounter information will be shared with them immediately following your visit.   A [facility] MyChart account gives you access to today's visit and all your visits, tests, and labs performed at [facility] " click here if you don't have a [facility] MyChart account or go to [email or web address]  Consent: (Patient) Jacqueline Erickson provided verbal consent for this virtual visit at the beginning of the encounter.  Current Medications:  Current Outpatient Medications:    Diroximel Fumarate (VUMERITY, STARTER, PO), Take by mouth., Disp: , Rfl:    etonogestrel (NEXPLANON) 68 MG IMPL implant, 1 each by Subdermal route once., Disp: , Rfl:    oxyCODONE-acetaminophen (PERCOCET) 5-325 MG tablet, Take 1 tablet by mouth every 4 (four) hours as needed for severe pain., Disp: 20 tablet, Rfl: 0   traZODone (DESYREL) 100 MG tablet, Take 1 tablet (100 mg total) by mouth at bedtime., Disp: 90 tablet, Rfl: 0   Medications ordered in this encounter:  No orders of the defined types were placed in this encounter.    *If you need refills on other medications prior to your next appointment, please contact your pharmacy*  Follow-Up: Call back or seek an in-person evaluation if the symptoms worsen or if the condition fails to improve as anticipated.  [facility] [phone]  Other Instructions I recommend going to the ER and letting them know that you have history of MS and are concerned about an MS flare.   If you have been instructed to have an in-person evaluation today at a local Urgent Care facility, please use the link below. It will take you to a list of all of our available [facility], including address, phone number and hours of operation. Please  do not delay care.  [facility]  If you or a family member do not have a primary care provider, use the link below to schedule a visit and establish care. When you choose a [facility] primary care physician or advanced practice provider, you gain a long-term partner in health. Find a Primary Care Provider  Learn more about [facility]'s in-office and virtual care options: [facility] - Get Care Now
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
## 2022-08-30 ENCOUNTER — Inpatient Hospital Stay: Payer: PRIVATE HEALTH INSURANCE | Admitting: Adult Health

## 2022-08-30 IMAGING — MR MR CERVICAL SPINE WO/W CM
5 of 8 series · 26 of 48 positions shown · IV contrast (20 ML MULTIHANCE)
Comparison: CT 10/05/2019

CLINICAL DATA: Multiple sclerosis, right-sided weakness

EXAM:
MRI CERVICAL SPINE WITHOUT AND WITH CONTRAST
TECHNIQUE: Multiplanar and multiecho pulse sequences of the cervical spine, to
include the craniocervical junction and cervicothoracic junction,
were obtained without and with intravenous contrast.
CONTRAST:  20mL MULTIHANCE GADOBENATE DIMEGLUMINE 529 MG/ML IV SOLN

[Series 3: T1 · sagittal · 3.0mm · 0.41mm/px · 4 of 13 slices shown (1 of 2)]
[im 1/13]
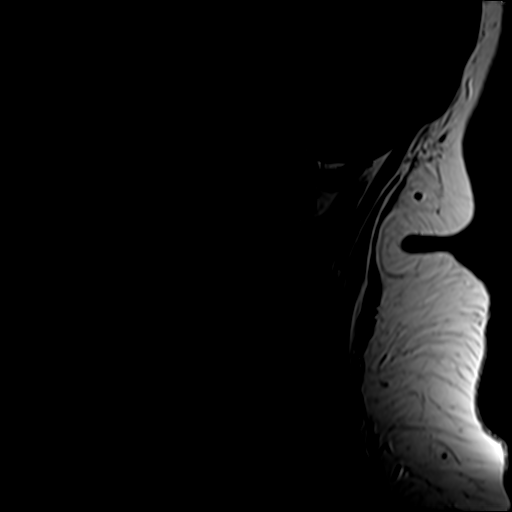
[im 5/13]
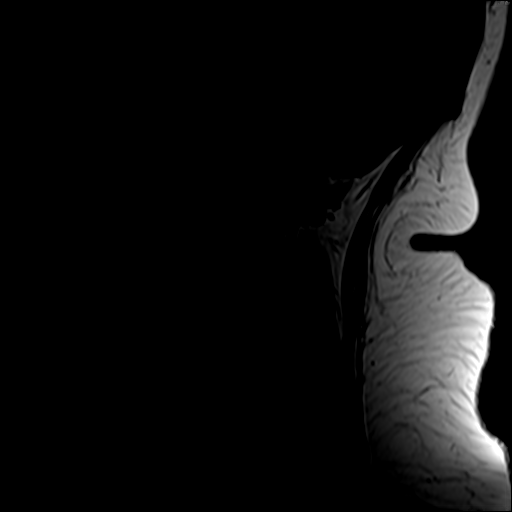
[im 9/13]
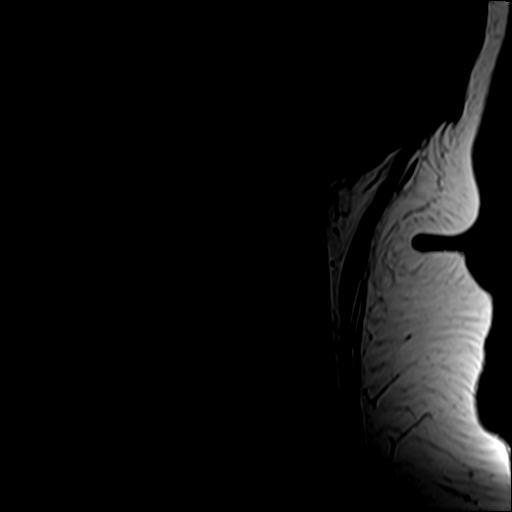
[im 13/13]
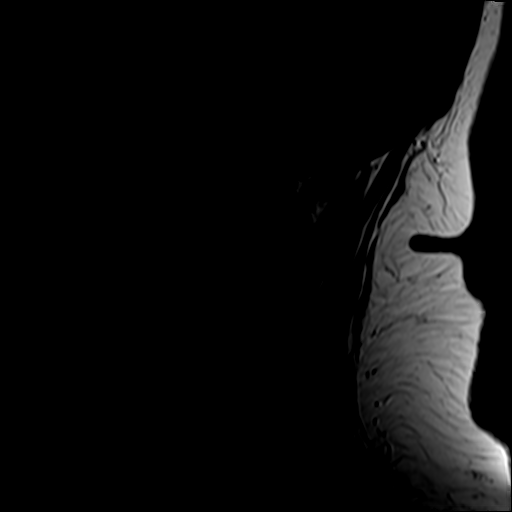

[Series 5: T2 · axial · 3.0mm · 0.70mm/px · z∈[-40,+53]mm · 8 of 26 slices shown (1 of 2)]
[im 1/26]
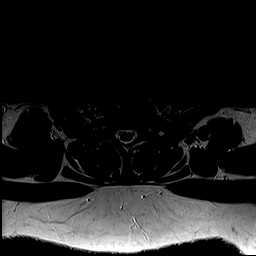
[im 4/26]
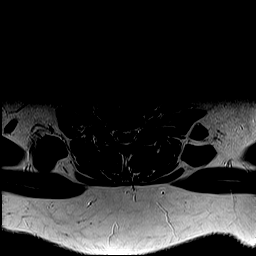
[im 8/26]
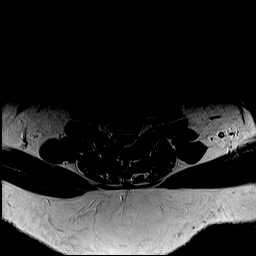
[im 11/26]
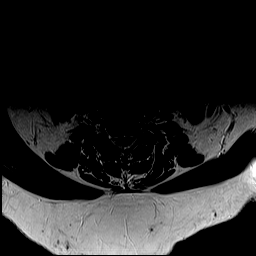
[im 15/26]
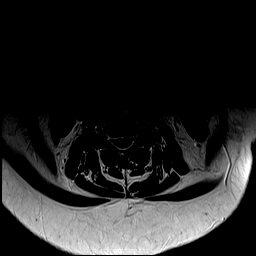
[im 18/26]
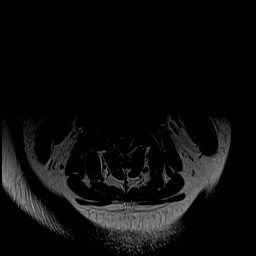
[im 22/26]
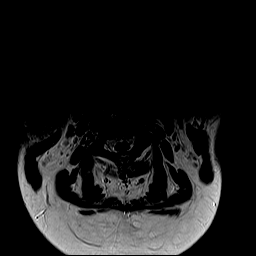
[im 26/26]
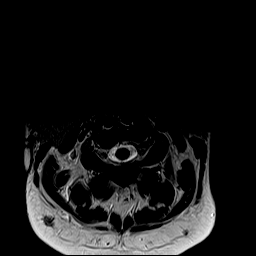

[Series 6: T1 · axial · 3.0mm · 0.35mm/px · z∈[-40,+53]mm · 8 of 26 slices shown (2 of 2)]
[im 1/26]
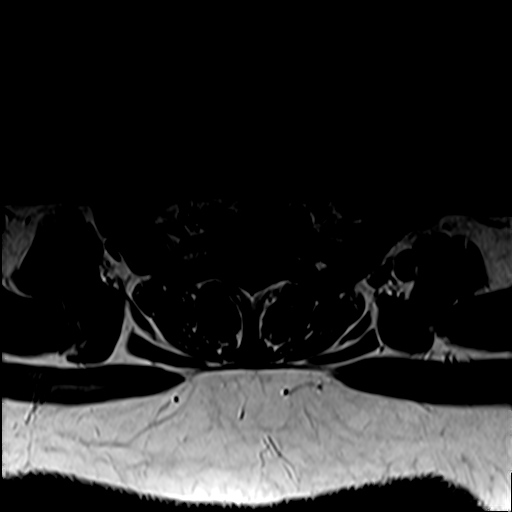
[im 4/26]
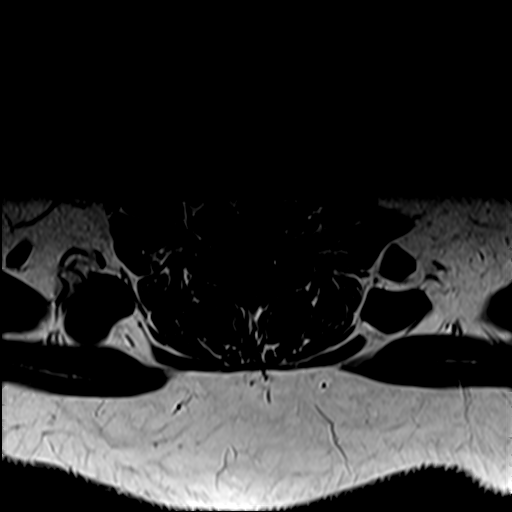
[im 8/26]
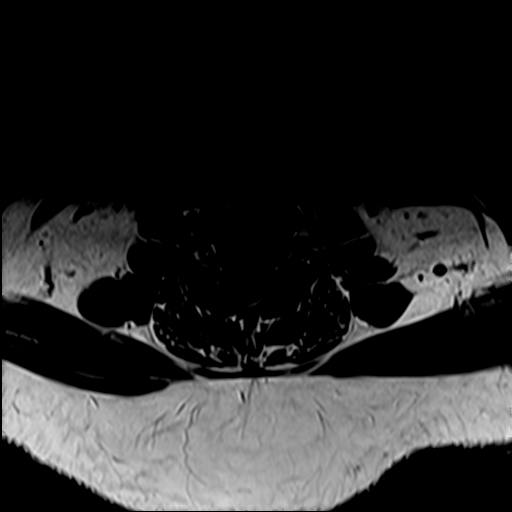
[im 11/26]
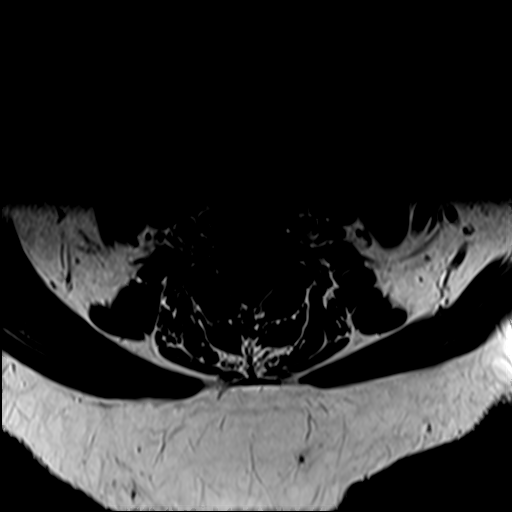
[im 15/26]
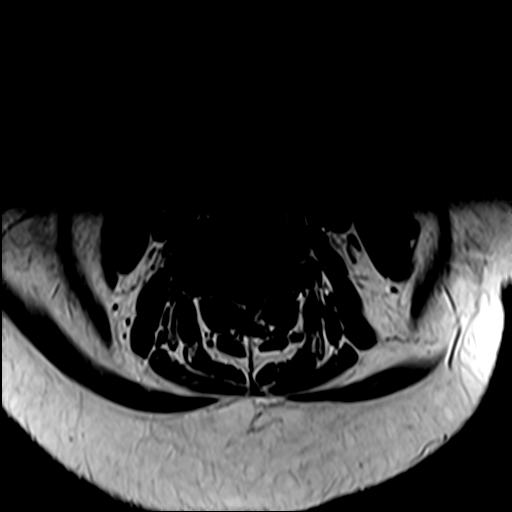
[im 18/26]
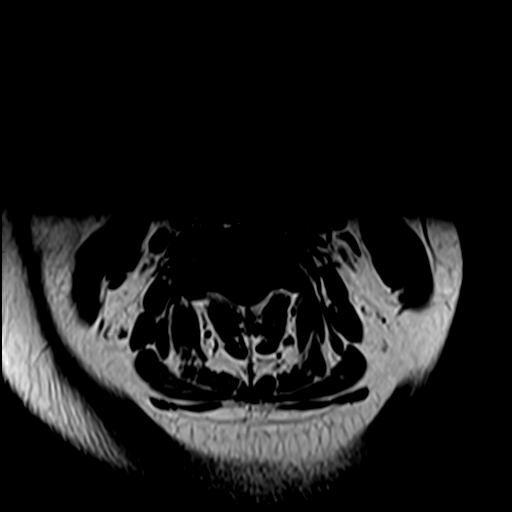
[im 22/26]
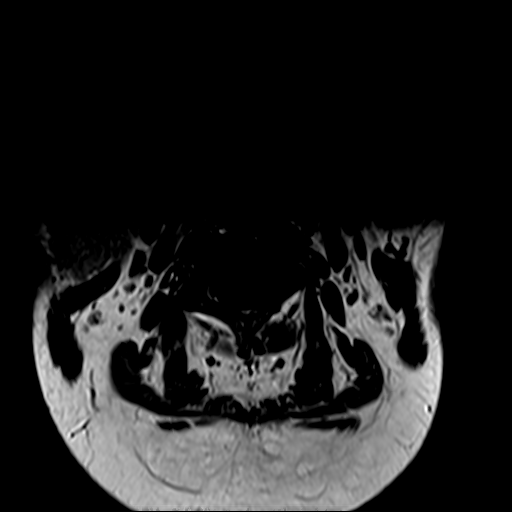
[im 26/26]
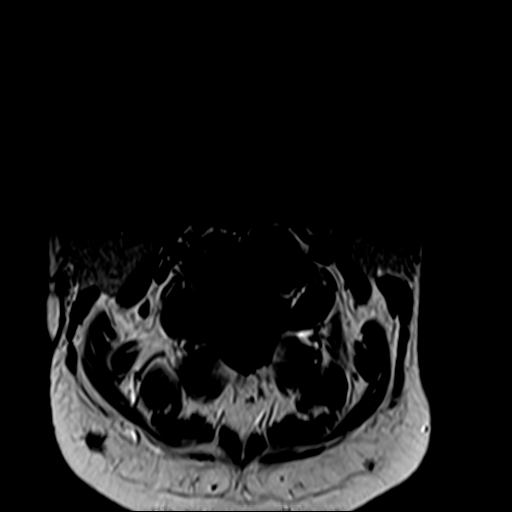

[Series 7: T2 · sagittal · 3.0mm · 0.41mm/px · 4 of 13 slices shown (2 of 2)]
[im 1/13]
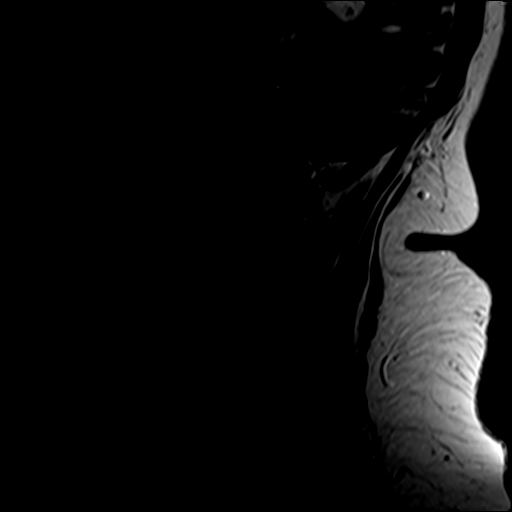
[im 5/13]
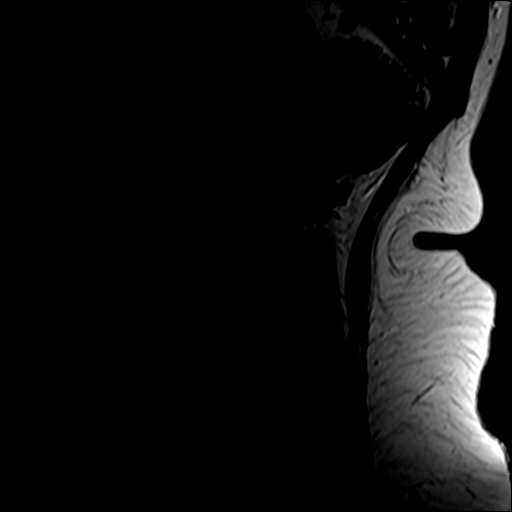
[im 9/13]
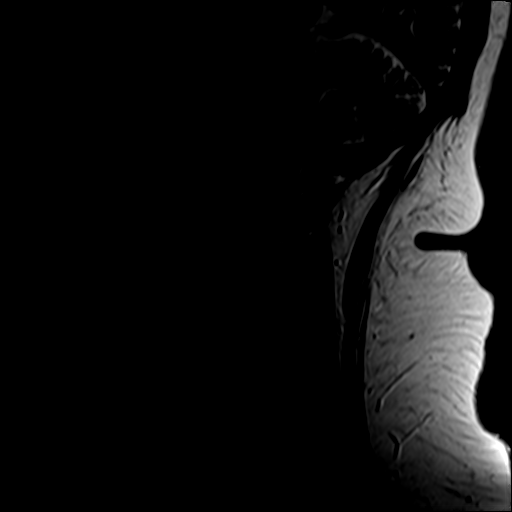
[im 13/13]
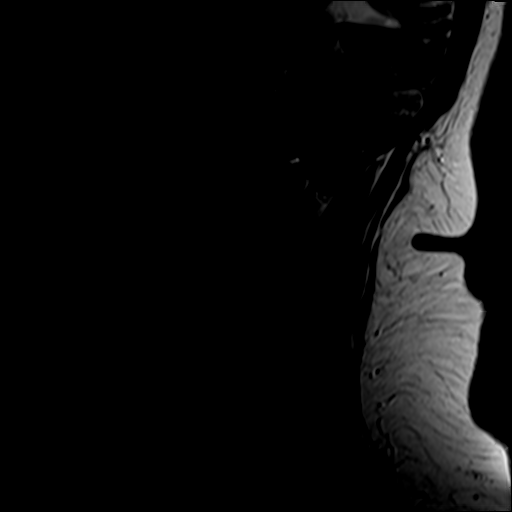

[Series 8: T1 fat-sat post-contrast · sagittal · 3.0mm · 0.82mm/px · 2 of 13 slices shown]
[im 1/13]
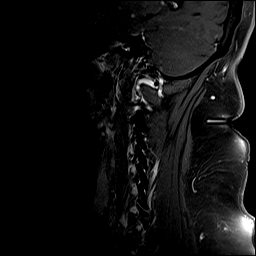
[im 5/13]
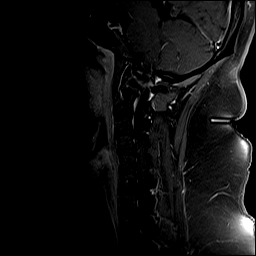

[26 of 48 positions shown; findings below may reference images not displayed]

FINDINGS: Alignment: Straightening of the cervical lordosis without static
listhesis.

Vertebrae: No fracture, evidence of discitis, or bone lesion.

Cord: Cervical cord is normal in signal and morphology. No focal
cord lesion. No abnormal postcontrast enhancement.

Posterior Fossa, vertebral arteries, paraspinal tissues: Negative.

Disc levels:

C2-C3: No significant disc protrusion, foraminal stenosis, or canal
stenosis.

C3-C4: No significant disc protrusion, foraminal stenosis, or canal
stenosis.

C4-C5: No significant disc protrusion, foraminal stenosis, or canal
stenosis.

C5-C6: No significant disc protrusion, foraminal stenosis, or canal
stenosis.

C6-C7: No significant disc protrusion, foraminal stenosis, or canal
stenosis.

C7-T1: No significant disc protrusion, foraminal stenosis, or canal
stenosis.
IMPRESSION: 1. No focal cord lesion or abnormal postcontrast enhancement of the
cervical spine.
2. No significant degenerative changes of the cervical spine. No
foraminal or canal stenosis.
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
## 2022-10-21 ENCOUNTER — Emergency Department (HOSPITAL_BASED_OUTPATIENT_CLINIC_OR_DEPARTMENT_OTHER)
Admission: EM | Admit: 2022-10-21 | Discharge: 2022-10-21 | Disposition: A | Discharge status: Home / Self Care | Payer: PRIVATE HEALTH INSURANCE | Attending: Emergency Medicine | Admitting: Emergency Medicine

## 2022-10-21 ENCOUNTER — Other Ambulatory Visit: Payer: Self-pay

## 2022-10-21 ENCOUNTER — Encounter (HOSPITAL_BASED_OUTPATIENT_CLINIC_OR_DEPARTMENT_OTHER): Payer: Self-pay

## 2022-10-21 DIAGNOSIS — M5441 Lumbago with sciatica, right side: Secondary | ICD-10-CM | POA: Diagnosis not present

## 2022-10-21 DIAGNOSIS — M545 Low back pain, unspecified: Secondary | ICD-10-CM | POA: Diagnosis present

## 2022-10-21 MED ORDER — KETOROLAC TROMETHAMINE 15 MG/ML IJ SOLN
15.0000 mg | Freq: Once | INTRAMUSCULAR | Status: AC
  Administered 2022-10-21: 15 mg via INTRAMUSCULAR
  Filled 2022-10-21: qty 1

## 2022-10-21 MED ORDER — METHOCARBAMOL 500 MG PO TABS: 500.0000 mg | ORAL_TABLET | Freq: Three times a day (TID) | ORAL | 0 refills | Status: DC | PRN

## 2022-10-21 MED ORDER — PREDNISONE 20 MG PO TABS
40.0000 mg | ORAL_TABLET | Freq: Once | ORAL | Status: AC
  Administered 2022-10-21: 40 mg via ORAL
  Filled 2022-10-21: qty 2
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
## 2022-10-21 NOTE — ED Triage Notes (Signed)
POV from home, GCS 15, A&O x 4, amb to triage.   C/o right lower back pain that radiates down to right leg and foot and sts that it radiates up to her upper back. Tylenol taken @ 9am Sts that she's been recently dx with MS and has recently started medications but doesn't know what a flare up would feel like.

## 2022-11-05 ENCOUNTER — Encounter (HOSPITAL_BASED_OUTPATIENT_CLINIC_OR_DEPARTMENT_OTHER): Payer: Self-pay

## 2022-11-05 ENCOUNTER — Emergency Department (HOSPITAL_BASED_OUTPATIENT_CLINIC_OR_DEPARTMENT_OTHER)
Admission: EM | Admit: 2022-11-05 | Discharge: 2022-11-06 | Disposition: A | Discharge status: Home / Self Care | Payer: PRIVATE HEALTH INSURANCE | Attending: Emergency Medicine | Admitting: Emergency Medicine

## 2022-11-05 ENCOUNTER — Other Ambulatory Visit: Payer: Self-pay

## 2022-11-05 DIAGNOSIS — G43809 Other migraine, not intractable, without status migrainosus: Secondary | ICD-10-CM | POA: Insufficient documentation

## 2022-11-05 DIAGNOSIS — T1592XA Foreign body on external eye, part unspecified, left eye, initial encounter: Secondary | ICD-10-CM

## 2022-11-05 DIAGNOSIS — T1502XA Foreign body in cornea, left eye, initial encounter: Secondary | ICD-10-CM | POA: Diagnosis not present

## 2022-11-05 DIAGNOSIS — X58XXXA Exposure to other specified factors, initial encounter: Secondary | ICD-10-CM | POA: Insufficient documentation
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
## 2022-11-05 NOTE — ED Notes (Signed)
Left Eye irrigated with Water. Tolerated well for a few minutes. Instructed to not rub or touch eye as much as possible.

## 2022-11-06 MED ORDER — KETOROLAC TROMETHAMINE 30 MG/ML IJ SOLN
30.0000 mg | Freq: Once | INTRAMUSCULAR | Status: AC
  Administered 2022-11-06: 30 mg via INTRAMUSCULAR
  Filled 2022-11-06: qty 1

## 2022-11-06 MED ORDER — PROCHLORPERAZINE EDISYLATE 10 MG/2ML IJ SOLN
10.0000 mg | Freq: Once | INTRAMUSCULAR | Status: AC
  Administered 2022-11-06: 10 mg via INTRAMUSCULAR
  Filled 2022-11-06: qty 2

## 2022-11-06 MED ORDER — TETRACAINE HCL 0.5 % OP SOLN
1.0000 [drp] | Freq: Once | OPHTHALMIC | Status: AC
  Administered 2022-11-06: 1 [drp] via OPHTHALMIC
  Filled 2022-11-06: qty 4

## 2022-11-06 MED ORDER — FLUORESCEIN SODIUM 1 MG OP STRP
1.0000 | ORAL_STRIP | Freq: Once | OPHTHALMIC | Status: AC
  Administered 2022-11-06: 1 via OPHTHALMIC
  Filled 2022-11-06: qty 1
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
## 2022-12-16 ENCOUNTER — Ambulatory Visit (HOSPITAL_COMMUNITY)
Admission: EM | Admit: 2022-12-16 | Discharge: 2022-12-16 | Disposition: A | Discharge status: Home / Self Care | Payer: PRIVATE HEALTH INSURANCE | Attending: Family Medicine | Admitting: Family Medicine

## 2022-12-16 ENCOUNTER — Encounter (HOSPITAL_COMMUNITY): Payer: Self-pay

## 2022-12-16 DIAGNOSIS — Z1152 Encounter for screening for COVID-19: Secondary | ICD-10-CM | POA: Insufficient documentation

## 2022-12-16 DIAGNOSIS — R519 Headache, unspecified: Secondary | ICD-10-CM | POA: Diagnosis not present

## 2022-12-16 DIAGNOSIS — J029 Acute pharyngitis, unspecified: Secondary | ICD-10-CM | POA: Insufficient documentation

## 2022-12-16 LAB — POCT RAPID STREP A (OFFICE): Rapid Strep A Screen: NEGATIVE

## 2022-12-16 MED ORDER — IBUPROFEN 800 MG PO TABS: 800.0000 mg | ORAL_TABLET | Freq: Three times a day (TID) | ORAL | 0 refills | Status: DC | PRN
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
## 2022-12-16 NOTE — ED Triage Notes (Signed)
"  I think I am getting a sinus infection, for about 3 days, sinus congestion, drainage, pain behind eyes, around eyes, headache at times". No fever.

## 2022-12-16 NOTE — Discharge Instructions (Signed)
Your strep test is negative.  Culture of the throat will be sent, and staff will notify you if that is in turn positive.  Take ibuprofen 800 mg--1 tab every 8 hours as needed for pain.   You have been swabbed for COVID, and the test will result in the next 24 hours. Our staff will call you if positive. If the COVID test is positive, you should quarantine until you are fever free for 24 hours and you are starting to feel better, and then take added precautions for the next 5 days, such as physical distancing/wearing a mask and good hand hygiene/washing.  

## 2022-12-17 ENCOUNTER — Telehealth: Payer: Self-pay | Admitting: Adult Health

## 2022-12-17 LAB — SARS CORONAVIRUS 2 (TAT 6-24 HRS): SARS Coronavirus 2: NEGATIVE

## 2022-12-17 NOTE — Telephone Encounter (Signed)
Pt called back to state she had symptoms for 4 days. Went to [facility] 12/16/22 and was tested for covid and strep with negative results for strep and still awaiting covid results.

## 2022-12-18 ENCOUNTER — Encounter: Payer: Self-pay | Admitting: Adult Health

## 2022-12-18 ENCOUNTER — Ambulatory Visit (INDEPENDENT_AMBULATORY_CARE_PROVIDER_SITE_OTHER): Payer: Medicaid Other | Admitting: Adult Health

## 2022-12-18 VITALS — BP 140/90 | HR 100 | Temp 98.7°F | Ht 62.0 in | Wt 252.0 lb

## 2022-12-18 DIAGNOSIS — I10 Essential (primary) hypertension: Secondary | ICD-10-CM | POA: Diagnosis not present

## 2022-12-18 DIAGNOSIS — J020 Streptococcal pharyngitis: Secondary | ICD-10-CM | POA: Diagnosis not present

## 2022-12-18 LAB — CULTURE, GROUP A STREP (THRC)

## 2022-12-18 MED ORDER — PREDNISONE 10 MG PO TABS: 10.0000 mg | ORAL_TABLET | Freq: Every day | ORAL | 0 refills | Status: DC

## 2022-12-18 MED ORDER — AMLODIPINE BESYLATE 2.5 MG PO TABS: 2.5000 mg | ORAL_TABLET | Freq: Every day | ORAL | 0 refills | Status: DC

## 2022-12-18 MED ORDER — AZITHROMYCIN 250 MG PO TABS: ORAL_TABLET | ORAL | 0 refills | Status: AC
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
## 2022-12-18 NOTE — Telephone Encounter (Signed)
Noted  

## 2022-12-19 ENCOUNTER — Encounter: Payer: Self-pay | Admitting: Adult Health

## 2023-01-13 ENCOUNTER — Other Ambulatory Visit: Payer: Self-pay | Admitting: Adult Health

## 2023-01-13 DIAGNOSIS — J020 Streptococcal pharyngitis: Secondary | ICD-10-CM

## 2023-01-15 ENCOUNTER — Encounter: Payer: Self-pay | Admitting: Adult Health

## 2023-01-15 ENCOUNTER — Ambulatory Visit (INDEPENDENT_AMBULATORY_CARE_PROVIDER_SITE_OTHER): Payer: PRIVATE HEALTH INSURANCE | Admitting: Adult Health

## 2023-01-15 VITALS — BP 120/82 | HR 97 | Temp 98.4°F | Ht 62.0 in | Wt 249.0 lb

## 2023-01-15 DIAGNOSIS — J020 Streptococcal pharyngitis: Secondary | ICD-10-CM | POA: Diagnosis not present

## 2023-01-15 DIAGNOSIS — J029 Acute pharyngitis, unspecified: Secondary | ICD-10-CM

## 2023-01-15 LAB — POCT RAPID STREP A (OFFICE): Rapid Strep A Screen: NEGATIVE

## 2023-01-15 MED ORDER — PREDNISONE 10 MG PO TABS: 10.0000 mg | ORAL_TABLET | Freq: Every day | ORAL | 0 refills | Status: DC
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
## 2023-01-22 ENCOUNTER — Encounter: Payer: PRIVATE HEALTH INSURANCE | Admitting: Adult Health
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
## 2023-03-14 ENCOUNTER — Inpatient Hospital Stay
Admission: RE | Admit: 2023-03-14 | Discharge: 2023-03-14 | Disposition: A | Discharge status: Home / Self Care | Payer: PRIVATE HEALTH INSURANCE | Source: Ambulatory Visit | Attending: Adult Health | Admitting: Adult Health

## 2023-03-19 ENCOUNTER — Ambulatory Visit
Admission: RE | Admit: 2023-03-19 | Discharge: 2023-03-19 | Disposition: A | Discharge status: Home / Self Care | Payer: PRIVATE HEALTH INSURANCE | Source: Ambulatory Visit | Attending: Internal Medicine | Admitting: Internal Medicine

## 2023-03-19 VITALS — BP 139/101 | HR 89 | Temp 98.6°F | Resp 16 | Ht 62.0 in | Wt 230.0 lb

## 2023-03-19 DIAGNOSIS — J029 Acute pharyngitis, unspecified: Secondary | ICD-10-CM | POA: Diagnosis not present

## 2023-03-19 DIAGNOSIS — J069 Acute upper respiratory infection, unspecified: Secondary | ICD-10-CM | POA: Insufficient documentation

## 2023-03-19 DIAGNOSIS — Z20822 Contact with and (suspected) exposure to covid-19: Secondary | ICD-10-CM | POA: Insufficient documentation

## 2023-03-19 LAB — POCT RAPID STREP A (OFFICE): Rapid Strep A Screen: NEGATIVE

## 2023-03-19 NOTE — Discharge Instructions (Signed)
Rapid strep is negative. throat culture and COVID tests are pending.  Will call if they are abnormal.  As we discussed, you most likely have COVID or another virus that should run its course.  Ensure adequate fluid hydration and rest. Follow up if any symptoms persist or worsen.
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
## 2023-03-19 NOTE — ED Triage Notes (Signed)
Patient here today with c/o headache, congestion, ST, weakness, and chills. All her symptoms started today. Many of her coworkers have tested positive for Covid.

## 2023-03-20 LAB — SARS CORONAVIRUS 2 (TAT 6-24 HRS): SARS Coronavirus 2: NEGATIVE

## 2023-03-20 LAB — CULTURE, GROUP A STREP (THRC)

## 2023-03-21 LAB — CULTURE, GROUP A STREP (THRC)

## 2023-07-16 ENCOUNTER — Other Ambulatory Visit (HOSPITAL_COMMUNITY)
Admission: RE | Admit: 2023-07-16 | Discharge: 2023-07-16 | Disposition: A | Discharge status: Home / Self Care | Payer: No Typology Code available for payment source | Source: Ambulatory Visit | Attending: Advanced Practice Midwife | Admitting: Advanced Practice Midwife

## 2023-07-16 ENCOUNTER — Ambulatory Visit: Payer: No Typology Code available for payment source | Admitting: Advanced Practice Midwife

## 2023-07-16 VITALS — BP 161/111 | HR 82 | Ht 62.0 in | Wt 252.6 lb

## 2023-07-16 DIAGNOSIS — Z975 Presence of (intrauterine) contraceptive device: Secondary | ICD-10-CM

## 2023-07-16 DIAGNOSIS — R030 Elevated blood-pressure reading, without diagnosis of hypertension: Secondary | ICD-10-CM

## 2023-07-16 DIAGNOSIS — B9689 Other specified bacterial agents as the cause of diseases classified elsewhere: Secondary | ICD-10-CM

## 2023-07-16 DIAGNOSIS — Z3046 Encounter for surveillance of implantable subdermal contraceptive: Secondary | ICD-10-CM | POA: Diagnosis not present

## 2023-07-16 DIAGNOSIS — N760 Acute vaginitis: Secondary | ICD-10-CM

## 2023-07-16 DIAGNOSIS — N898 Other specified noninflammatory disorders of vagina: Secondary | ICD-10-CM | POA: Diagnosis present

## 2023-07-16 MED ORDER — METRONIDAZOLE 500 MG PO TABS: 500.0000 mg | ORAL_TABLET | Freq: Two times a day (BID) | ORAL | 0 refills | Status: AC

## 2023-07-16 MED ORDER — ETONOGESTREL 68 MG ~~LOC~~ IMPL
68.0000 mg | DRUG_IMPLANT | Freq: Once | SUBCUTANEOUS | Status: AC
  Administered 2023-07-16: 68 mg via SUBCUTANEOUS

## 2023-07-16 NOTE — Progress Notes (Signed)
   GYNECOLOGY PROGRESS NOTE  History:  32 y.o. G0P0000 presents to [facility] office today for Nexplanon removal and reinsertion and problem gyn visit. She reports Nexplanon placed  4-5 years ago, her third Nexplanon, a method that works well for the patient.    She also has vaginal irritation/odor c/w bacterial vaginosis. She denies h/a, dizziness, shortness of breath, n/v, or fever/chills.    The following portions of the patient's history were reviewed and updated as appropriate: allergies, current medications, past family history, past medical history, past social history, past surgical history and problem list. Last pap smear on 10/31/21 was normal.  Health Maintenance Due  Topic Date Due   INFLUENZA VACCINE  03/28/2023   COVID-19 Vaccine (1 - 2023-24 season) Never done     Review of Systems:  Pertinent items are noted in HPI.   Objective:  Physical Exam Blood pressure (!) 161/111, pulse 82, height 5\' 2"  (1.575 m), weight 252 lb 9.6 oz (114.6 kg), last menstrual period 06/10/2023. VS reviewed, nursing note reviewed,  Constitutional: well developed, well nourished, no distress HEENT: normocephalic CV: normal rate Pulm/chest wall: normal effort Breast Exam: deferred Abdomen: soft Neuro: alert and oriented x 3 Skin: warm, dry Psych: affect normal Pelvic exam: Cervix pink, visually closed, without lesion, scant white creamy discharge, vaginal walls and external genitalia normal Bimanual exam: Cervix 0/long/high, firm, anterior, neg CMT, uterus nontender, nonenlarged, adnexa without tenderness, enlargement, or mass    Nexplanon Removal and Insertion  Patient identified, informed consent performed, consent signed.   Patient does understand that irregular bleeding is a very common side effect of this medication. She was advised to have backup contraception for one week after replacement of the implant. Pregnancy test in clinic today was negative.  Appropriate time out taken. Implanon  site identified. Area prepped in usual sterile fashon. One ml of 1% lidocaine was used to anesthetize the area at the distal end of the implant. A small stab incision was made right beside the implant on the distal portion. The Nexplanon rod was grasped using hemostats and removed with some difficulty. When the device was removed, it was found to be broken almost in two pieces and bent.  The full device was removed and inspected.  There was minimal blood loss. There were no complications. Area was then injected with 3 ml of 1 % lidocaine. She was re-prepped with betadine, Nexplanon removed from packaging, Device confirmed in needle, then inserted full length of needle and withdrawn per handbook instructions. Nexplanon was able to palpated in the patient's arm; patient palpated the insert herself.  There was minimal blood loss. Patient insertion site covered with guaze and a pressure bandage to reduce any bruising. The patient tolerated the procedure well and was given post procedure instructions.    Assessment & Plan:  1. Vaginal irritation --Hx BV, pt feels like symptoms are similar --Rx for Flagyl BID x 7 days  - Cervicovaginal ancillary only( [facility])  2. Encounter for removal and reinsertion of Nexplanon --See procedure note above  3. BV (bacterial vaginosis)   4. Elevated blood-pressure reading, without diagnosis of hypertension --Pt has PCP who is following.  Pt to make appt with PCP, encouraged home cuff and blood pressure monitoring to present to PCP.     Return in about 1 year (around 07/15/2024) for annual exam.   Sharen Counter, CNM 5:30 PM

## 2023-07-17 LAB — CERVICOVAGINAL ANCILLARY ONLY
Bacterial Vaginitis (gardnerella): POSITIVE — AB
Candida Glabrata: NEGATIVE
Candida Vaginitis: NEGATIVE
Chlamydia: NEGATIVE
Comment: NEGATIVE
Comment: NEGATIVE
Comment: NEGATIVE
Comment: NEGATIVE
Comment: NEGATIVE
Comment: NORMAL
Neisseria Gonorrhea: NEGATIVE
Trichomonas: NEGATIVE

## 2023-07-31 ENCOUNTER — Ambulatory Visit
Admission: EM | Admit: 2023-07-31 | Discharge: 2023-07-31 | Disposition: A | Discharge status: Home / Self Care | Payer: No Typology Code available for payment source | Attending: Internal Medicine | Admitting: Internal Medicine

## 2023-07-31 DIAGNOSIS — J019 Acute sinusitis, unspecified: Secondary | ICD-10-CM | POA: Diagnosis not present

## 2023-07-31 DIAGNOSIS — B9689 Other specified bacterial agents as the cause of diseases classified elsewhere: Secondary | ICD-10-CM | POA: Diagnosis not present

## 2023-07-31 MED ORDER — FLUTICASONE PROPIONATE 50 MCG/ACT NA SUSP: 1.0000 | Freq: Every day | NASAL | 0 refills | Status: DC

## 2023-07-31 MED ORDER — DOXYCYCLINE HYCLATE 100 MG PO CAPS: 100.0000 mg | ORAL_CAPSULE | Freq: Two times a day (BID) | ORAL | 0 refills | Status: DC

## 2023-07-31 NOTE — ED Triage Notes (Signed)
Pt present with c/o runny nose, nasl congestion x 1 wk. States for the past 4 days she has had green mucus coming out of nose. Pt states she has had amildcough

## 2023-07-31 NOTE — Discharge Instructions (Signed)
Start doxycycline twice daily for 10 days.  Flonase daily.  Nasal rinses as tolerated.  Lots of rest and fluids.  Please follow-up with your PCP if your symptoms do not improve.  Please go to the ER for any worsening symptoms.  I hope you feel better soon!
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
## 2023-10-22 ENCOUNTER — Ambulatory Visit
Admission: EM | Admit: 2023-10-22 | Discharge: 2023-10-22 | Disposition: A | Discharge status: Home / Self Care | Payer: No Typology Code available for payment source | Attending: Family Medicine | Admitting: Family Medicine

## 2023-10-22 DIAGNOSIS — B349 Viral infection, unspecified: Secondary | ICD-10-CM | POA: Diagnosis not present

## 2023-10-22 LAB — POC COVID19/FLU A&B COMBO
Covid Antigen, POC: NEGATIVE
Influenza A Antigen, POC: NEGATIVE
Influenza B Antigen, POC: NEGATIVE

## 2023-10-22 LAB — POCT RAPID STREP A (OFFICE): Rapid Strep A Screen: NEGATIVE

## 2023-10-22 MED ORDER — PSEUDOEPHEDRINE HCL 30 MG PO TABS: 30.0000 mg | ORAL_TABLET | Freq: Three times a day (TID) | ORAL | 0 refills | Status: DC | PRN

## 2023-10-22 MED ORDER — PROMETHAZINE-DM 6.25-15 MG/5ML PO SYRP: 5.0000 mL | ORAL_SOLUTION | Freq: Three times a day (TID) | ORAL | 0 refills | Status: DC | PRN

## 2023-10-22 MED ORDER — IBUPROFEN 600 MG PO TABS: 600.0000 mg | ORAL_TABLET | Freq: Four times a day (QID) | ORAL | 0 refills | Status: DC | PRN

## 2023-10-22 MED ORDER — CETIRIZINE HCL 10 MG PO TABS: 10.0000 mg | ORAL_TABLET | Freq: Every day | ORAL | 0 refills | Status: DC
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
## 2023-10-22 NOTE — Discharge Instructions (Signed)
 We will manage this as a viral illness. For sore throat or cough try using a honey-based tea. Use 3 teaspoons of honey with juice squeezed from half lemon. Place shaved pieces of ginger into 1/2-1 cup of water and warm over stove top. Then mix the ingredients and repeat every 4 hours as needed. Please take ibuprofen 600mg  every 6 hours with food alternating with OR taken together with Tylenol 500mg -650mg  every 6 hours for throat pain, fevers, aches and pains. Hydrate very well with at least 2 liters of water. Eat light meals such as soups (chicken and noodles, vegetable, chicken and wild rice).  Do not eat foods that you are allergic to.  Taking an antihistamine like Zyrtec (10mg  daily) can help against postnasal drainage, sinus congestion which can cause sinus pain, sinus headaches, throat pain, painful swallowing, coughing.  You can take this together with pseudoephedrine (Sudafed) at a dose of 30mg  3 times a day or twice daily as needed for the same kind of nasal drip, congestion.  Use cough syrup as needed.

## 2023-10-22 NOTE — ED Triage Notes (Signed)
 Pt reports cough, sore throat,  congestion, neck swelling, and glands swelling x 4 days. Sudafed and Nyquil gives some relief.

## 2023-12-21 ENCOUNTER — Other Ambulatory Visit: Payer: Self-pay

## 2023-12-21 ENCOUNTER — Emergency Department (HOSPITAL_BASED_OUTPATIENT_CLINIC_OR_DEPARTMENT_OTHER): Admitting: Radiology

## 2023-12-21 ENCOUNTER — Emergency Department (HOSPITAL_BASED_OUTPATIENT_CLINIC_OR_DEPARTMENT_OTHER): Admission: EM | Admit: 2023-12-21 | Discharge: 2023-12-21 | Disposition: A | Discharge status: Home / Self Care

## 2023-12-21 ENCOUNTER — Encounter (HOSPITAL_BASED_OUTPATIENT_CLINIC_OR_DEPARTMENT_OTHER): Payer: Self-pay

## 2023-12-21 DIAGNOSIS — M25561 Pain in right knee: Secondary | ICD-10-CM | POA: Diagnosis not present

## 2023-12-21 DIAGNOSIS — S8991XA Unspecified injury of right lower leg, initial encounter: Secondary | ICD-10-CM | POA: Diagnosis not present

## 2023-12-21 DIAGNOSIS — Z79899 Other long term (current) drug therapy: Secondary | ICD-10-CM | POA: Insufficient documentation

## 2023-12-21 DIAGNOSIS — W19XXXA Unspecified fall, initial encounter: Secondary | ICD-10-CM | POA: Diagnosis not present

## 2023-12-21 MED ORDER — OXYCODONE HCL 5 MG PO TABS: 5.0000 mg | ORAL_TABLET | Freq: Four times a day (QID) | ORAL | 0 refills | Status: DC | PRN

## 2023-12-21 MED ORDER — OXYCODONE HCL 5 MG PO TABS
5.0000 mg | ORAL_TABLET | Freq: Once | ORAL | Status: AC
  Administered 2023-12-21: 5 mg via ORAL
  Filled 2023-12-21: qty 1

## 2023-12-21 MED ORDER — NAPROXEN 500 MG PO TABS: 500.0000 mg | ORAL_TABLET | Freq: Two times a day (BID) | ORAL | 0 refills | Status: DC
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
## 2023-12-21 NOTE — ED Triage Notes (Signed)
 Pt presents via POV c/o right knee pain. Reports fell last night.

## 2023-12-21 NOTE — Discharge Instructions (Addendum)
 Please read and follow all provided instructions.  Your diagnoses today include:  1. Injury of right knee, initial encounter     Tests performed today include: An x-ray of the affected area - does NOT show any broken bones Vital signs. See below for your results today.   Medications prescribed:  Oxycodone  - narcotic pain medication  DO NOT drive or perform any activities that require you to be awake and alert because this medicine can make you drowsy.   Naproxen - anti-inflammatory pain medication Do not exceed 500mg  naproxen every 12 hours, take with food  You have been prescribed an anti-inflammatory medication or NSAID. Take with food. Take smallest effective dose for the shortest duration needed for your pain. Stop taking if you experience stomach pain or vomiting.   Take any prescribed medications only as directed.  Home care instructions:  Follow any educational materials contained in this packet Follow R.I.C.E. Protocol: R - rest your injury  I  - use ice on injury without applying directly to skin C - compress injury with bandage or splint E - elevate the injury as much as possible  Follow-up instructions: Please follow-up with your primary care provider or the provided orthopedic physician (bone specialist) if you continue to have significant pain in 1 week. In this case you may have a more severe injury that requires further care.   Return instructions:  Please return if your toes or feet are numb or tingling, appear gray or blue, or you have severe pain (also elevate the leg and loosen splint or wrap if you were given one) Please return to the Emergency Department if you experience worsening symptoms.  Please return if you have any other emergent concerns.  Additional Information:  Your vital signs today were: BP (!) 125/93   Pulse 86   Temp 98.3 F (36.8 C) (Oral)   Resp 16   SpO2 100%  If your blood pressure (BP) was elevated above 135/85 this visit, please  have this repeated by your doctor within one month. -------------- If prescribed crutches for your injury: use crutches with non-weight bearing for the first few days. Then, you may walk as the pain allows, or as instructed. Start gradually with weight bearing on the affected side. Once you can walk pain free, then try jogging. When you can run forwards, then you can try moving side-to-side. If you cannot walk without crutches in one week, you need a re-check. --------------

## 2023-12-23 ENCOUNTER — Emergency Department (HOSPITAL_COMMUNITY)
Admission: EM | Admit: 2023-12-23 | Discharge: 2023-12-23 | Disposition: A | Discharge status: Home / Self Care | Attending: Emergency Medicine | Admitting: Emergency Medicine

## 2023-12-23 ENCOUNTER — Encounter (HOSPITAL_COMMUNITY): Payer: Self-pay

## 2023-12-23 ENCOUNTER — Other Ambulatory Visit: Payer: Self-pay

## 2023-12-23 DIAGNOSIS — W19XXXD Unspecified fall, subsequent encounter: Secondary | ICD-10-CM | POA: Diagnosis not present

## 2023-12-23 DIAGNOSIS — M25561 Pain in right knee: Secondary | ICD-10-CM | POA: Diagnosis not present

## 2023-12-23 DIAGNOSIS — S8991XD Unspecified injury of right lower leg, subsequent encounter: Secondary | ICD-10-CM | POA: Diagnosis not present

## 2023-12-23 DIAGNOSIS — S8391XD Sprain of unspecified site of right knee, subsequent encounter: Secondary | ICD-10-CM | POA: Insufficient documentation

## 2023-12-23 MED ORDER — HYDROCODONE-ACETAMINOPHEN 10-325 MG PO TABS: 1.0000 | ORAL_TABLET | Freq: Four times a day (QID) | ORAL | 0 refills | Status: AC | PRN

## 2023-12-23 NOTE — ED Triage Notes (Signed)
 Right knee pain, saw [facility] today and MRI scheduled on Thursday, but pt insurance will not cover due to being out of network.Pt had previous ligament surgery in 2021 and fell on knee Saturday morning and reinjured it.

## 2023-12-23 NOTE — Discharge Instructions (Addendum)
 Follow up for Mri as scheduled.  See your Orthopaedist for recheck as scheduled

## 2023-12-24 NOTE — ED Provider Notes (Signed)
 [place] EMERGENCY DEPARTMENT AT [facility] Provider Note   CSN: [id number] Arrival date & time: 12/23/23  1128     History  Chief Complaint  Patient presents with   Knee Pain    [facility] is a 33 y.o. female.  Patient reports that she fell and injured her knee on 426.  Patient was seen and had an x-ray.  Patient reports that she went to [facility] urgent care today.  Patient states that she was set up for an MRI on Thursday.  Patient states that she is hoping to get MRI done today.  Patient has a history of knee reconstruction and is concerned that she has a tear that needs to be repaired.  Patient was given a knee brace.  Patient states that she is weightbearing but is having pain.  Patient complains of swelling and pain.  The history is provided by the patient. No language interpreter was used.  Knee Pain Location:  Knee      Home Medications Prior to Admission medications   Medication Sig Start Date End Date Taking? Authorizing Provider  HYDROcodone -acetaminophen  (NORCO) 10-325 MG tablet Take 1 tablet by mouth every 6 (six) hours as needed for up to 5 days. 12/23/23 12/28/23 Yes Sandi Crosby, PA-C  cetirizine  (ZYRTEC  ALLERGY) 10 MG tablet Take 1 tablet (10 mg total) by mouth daily. 10/22/23   Adolph Hoop, PA-C  doxycycline  (VIBRAMYCIN ) 100 MG capsule Take 1 capsule (100 mg total) by mouth 2 (two) times daily. 07/31/23   Mayer, Jodi R, NP  etonogestrel  (NEXPLANON ) 68 MG IMPL implant 1 each by Subdermal route once.    [provider]  fluticasone  (FLONASE ) 50 MCG/ACT nasal spray Place 1 spray into both nostrils daily. 07/31/23   Mayer, Jodi R, NP  ibuprofen  (ADVIL ) 600 MG tablet Take 1 tablet (600 mg total) by mouth every 6 (six) hours as needed. 10/22/23   Adolph Hoop, PA-C  naproxen  (NAPROSYN ) 500 MG tablet Take 1 tablet (500 mg total) by mouth 2 (two) times daily. 12/21/23   Lyna Sandhoff, PA-C  oxyCODONE  (OXY IR/ROXICODONE ) 5 MG immediate  release tablet Take 1 tablet (5 mg total) by mouth every 6 (six) hours as needed for severe pain (pain score 7-10). 12/21/23   Lyna Sandhoff, PA-C  promethazine -dextromethorphan (PROMETHAZINE -DM) 6.25-15 MG/5ML syrup Take 5 mLs by mouth 3 (three) times daily as needed for cough. 10/22/23   Adolph Hoop, PA-C  Pseudoeph-Doxylamine-DM-APAP (NYQUIL PO) Take by mouth.    [provider]  pseudoephedrine  (SUDAFED) 30 MG tablet Take 1 tablet (30 mg total) by mouth every 8 (eight) hours as needed for congestion. 10/22/23   Adolph Hoop, PA-C      Allergies    Augmentin [amoxicillin-pot clavulanate], Garlic powder [garlic], Histamine, and Watermelon flavoring agent (non-screening)    Review of Systems   Review of Systems  All other systems reviewed and are negative.   Physical Exam Updated Vital Signs BP (!) 151/90 (BP Location: Right Arm)   Pulse 72   Temp 98.3 F (36.8 C) (Oral)   Resp 18   Ht 5\' 3"  (1.6 m)   Wt 113.4 kg   SpO2 100%   BMI 44.29 kg/m  Physical Exam Vitals and nursing note reviewed.  Constitutional:      Appearance: She is well-developed.  HENT:     Head: Normocephalic.  Pulmonary:     Effort: Pulmonary effort is normal.  Abdominal:     General: There is no distension.  Musculoskeletal:        General: Swelling and tenderness present.     Cervical back: Normal range of motion.     Comments: Surgical scar, swelling knee, effusion noted   Neurological:     General: No focal deficit present.     Mental Status: She is alert and oriented to person, place, and time.  Psychiatric:        Mood and Affect: Mood normal.     ED Results / Procedures / Treatments   Labs (all labs ordered are listed, but only abnormal results are displayed) Labs Reviewed - No data to display  EKG None  Radiology No results found.  Procedures Procedures    Medications Ordered in ED Medications - No data to display  ED Course/ Medical Decision Making/ A&P                                  Medical Decision Making Patient complains of continued pain to right knee.  Patient is concerned she has ligamentous damage.  She is scheduled for an MRI on Thursday  Amount and/or Complexity of Data Reviewed External Data Reviewed: notes.    Details: Previous ED notes reviewed right knee showed no evidence of fracture  Risk Prescription drug management. Risk Details: Patient is given a prescription for hydrocodone .  She is advised to keep follow-up appointment for MRI.  Follow-up with her orthopedist as scheduled.  Patient is advised to ice and elevate.  She is advised no weightbearing.  Return if any problems           Final Clinical Impression(s) / ED Diagnoses Final diagnoses:  Sprain of right knee, unspecified ligament, subsequent encounter    Rx / DC Orders ED Discharge Orders          Ordered    HYDROcodone -acetaminophen  (NORCO) 10-325 MG tablet  Every 6 hours PRN        12/23/23 1424           An After Visit Summary was printed and given to the patient.    Yasuko Lapage K, PA-C 12/24/23 2149    Deatra Face, MD 12/27/23 2113

## 2023-12-26 DIAGNOSIS — M25561 Pain in right knee: Secondary | ICD-10-CM | POA: Diagnosis not present

## 2024-01-02 ENCOUNTER — Ambulatory Visit (INDEPENDENT_AMBULATORY_CARE_PROVIDER_SITE_OTHER): Admitting: Adult Health

## 2024-01-02 VITALS — BP 130/84 | HR 77 | Temp 98.8°F | Ht 63.0 in | Wt 249.0 lb

## 2024-01-02 DIAGNOSIS — M25561 Pain in right knee: Secondary | ICD-10-CM | POA: Diagnosis not present
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
## 2024-01-09 ENCOUNTER — Encounter: Admitting: Family Medicine

## 2024-01-15 ENCOUNTER — Ambulatory Visit (INDEPENDENT_AMBULATORY_CARE_PROVIDER_SITE_OTHER): Admitting: Adult Health

## 2024-01-15 ENCOUNTER — Encounter: Payer: Self-pay | Admitting: Adult Health

## 2024-01-15 VITALS — BP 130/80 | HR 88 | Temp 98.1°F | Ht 62.0 in | Wt 249.0 lb

## 2024-01-15 DIAGNOSIS — G35 Multiple sclerosis: Secondary | ICD-10-CM

## 2024-01-15 DIAGNOSIS — Z0000 Encounter for general adult medical examination without abnormal findings: Secondary | ICD-10-CM

## 2024-01-15 DIAGNOSIS — E66813 Obesity, class 3: Secondary | ICD-10-CM

## 2024-01-15 DIAGNOSIS — F5101 Primary insomnia: Secondary | ICD-10-CM

## 2024-01-15 LAB — LIPID PANEL
Cholesterol: 211 mg/dL — ABNORMAL HIGH (ref 0–200)
HDL: 35.8 mg/dL — ABNORMAL LOW (ref >39.00)
LDL Cholesterol: 147 mg/dL — ABNORMAL HIGH (ref 0–99)
NonHDL: 175.12
Total CHOL/HDL Ratio: 6
Triglycerides: 143 mg/dL (ref 0.0–149.0)
VLDL: 28.6 mg/dL (ref 0.0–40.0)

## 2024-01-15 LAB — URINALYSIS
Bilirubin Urine: NEGATIVE
Hgb urine dipstick: NEGATIVE
Ketones, ur: NEGATIVE
Leukocytes,Ua: NEGATIVE
Nitrite: NEGATIVE
Specific Gravity, Urine: 1.02 (ref 1.000–1.030)
Total Protein, Urine: NEGATIVE
Urine Glucose: NEGATIVE
Urobilinogen, UA: 0.2 (ref 0.0–1.0)
pH: 6 (ref 5.0–8.0)

## 2024-01-15 LAB — CBC WITH DIFFERENTIAL/PLATELET
Basophils Absolute: 0 10*3/uL (ref 0.0–0.1)
Basophils Relative: 0.4 % (ref 0.0–3.0)
Eosinophils Absolute: 0.2 10*3/uL (ref 0.0–0.7)
Eosinophils Relative: 1.7 % (ref 0.0–5.0)
HCT: 41.6 % (ref 36.0–46.0)
Hemoglobin: 13.7 g/dL (ref 12.0–15.0)
Lymphocytes Relative: 31.3 % (ref 12.0–46.0)
Lymphs Abs: 3.4 10*3/uL (ref 0.7–4.0)
MCHC: 33 g/dL (ref 30.0–36.0)
MCV: 88.3 fl (ref 78.0–100.0)
Monocytes Absolute: 0.7 10*3/uL (ref 0.1–1.0)
Monocytes Relative: 6.9 % (ref 3.0–12.0)
Neutro Abs: 6.4 10*3/uL (ref 1.4–7.7)
Neutrophils Relative %: 59.7 % (ref 43.0–77.0)
Platelets: 333 10*3/uL (ref 150.0–400.0)
RBC: 4.71 Mil/uL (ref 3.87–5.11)
RDW: 14.2 % (ref 11.5–15.5)
WBC: 10.7 10*3/uL — ABNORMAL HIGH (ref 4.0–10.5)

## 2024-01-15 LAB — COMPREHENSIVE METABOLIC PANEL WITH GFR
ALT: 13 U/L (ref 0–35)
AST: 13 U/L (ref 0–37)
Albumin: 4.5 g/dL (ref 3.5–5.2)
Alkaline Phosphatase: 71 U/L (ref 39–117)
BUN: 8 mg/dL (ref 6–23)
CO2: 24 meq/L (ref 19–32)
Calcium: 9.6 mg/dL (ref 8.4–10.5)
Chloride: 105 meq/L (ref 96–112)
Creatinine, Ser: 0.71 mg/dL (ref 0.40–1.20)
GFR: 114.05 mL/min (ref >60.00)
Glucose, Bld: 92 mg/dL (ref 70–99)
Potassium: 3.7 meq/L (ref 3.5–5.1)
Sodium: 137 meq/L (ref 135–145)
Total Bilirubin: 1 mg/dL (ref 0.2–1.2)
Total Protein: 7.7 g/dL (ref 6.0–8.3)

## 2024-01-15 LAB — HEMOGLOBIN A1C: Hgb A1c MFr Bld: 6 % (ref 4.6–6.5)

## 2024-01-15 LAB — TSH: TSH: 1.06 u[IU]/mL (ref 0.35–5.50)

## 2024-01-15 LAB — VITAMIN D 25 HYDROXY (VIT D DEFICIENCY, FRACTURES): VITD: <7 ng/mL — ABNORMAL LOW (ref 30.00–100.00)
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
## 2024-01-15 NOTE — Patient Instructions (Signed)
 It was great seeing you today   We will follow up with you regarding your lab work   Please let me know if you need anything

## 2024-01-16 ENCOUNTER — Other Ambulatory Visit: Payer: Self-pay | Admitting: Adult Health

## 2024-01-16 ENCOUNTER — Ambulatory Visit: Payer: Self-pay | Admitting: Adult Health

## 2024-01-16 ENCOUNTER — Ambulatory Visit: Admitting: Nurse Practitioner

## 2024-01-16 ENCOUNTER — Encounter: Payer: Self-pay | Admitting: Nurse Practitioner

## 2024-01-16 MED ORDER — VITAMIN D (ERGOCALCIFEROL) 1.25 MG (50000 UNIT) PO CAPS: 50000.0000 [IU] | ORAL_CAPSULE | ORAL | 1 refills | Status: AC

## 2024-01-21 ENCOUNTER — Ambulatory Visit (INDEPENDENT_AMBULATORY_CARE_PROVIDER_SITE_OTHER): Admitting: Neurology

## 2024-01-21 ENCOUNTER — Encounter: Payer: Self-pay | Admitting: Neurology

## 2024-01-21 VITALS — BP 151/95 | HR 82 | Ht 62.0 in | Wt 249.0 lb

## 2024-01-21 DIAGNOSIS — G35 Multiple sclerosis: Secondary | ICD-10-CM

## 2024-01-21 DIAGNOSIS — G35A Relapsing-remitting multiple sclerosis: Secondary | ICD-10-CM

## 2024-01-21 MED ORDER — DIMETHYL FUMARATE STARTER PACK 120 & 240 MG PO CDPK: DELAYED_RELEASE_CAPSULE | ORAL | 5 refills | Status: DC
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
## 2024-01-23 ENCOUNTER — Other Ambulatory Visit (HOSPITAL_COMMUNITY): Payer: Self-pay

## 2024-01-24 ENCOUNTER — Telehealth: Payer: Self-pay | Admitting: Pharmacy Technician

## 2024-01-24 ENCOUNTER — Other Ambulatory Visit (HOSPITAL_COMMUNITY): Payer: Self-pay

## 2024-01-24 NOTE — Telephone Encounter (Signed)
 Pharmacy Patient Advocate Encounter   Received notification from Pt Calls Messages that prior authorization for Dimethyl Fumarate  Starter Pack is required/requested.   Insurance verification completed.   The patient is insured through [facility] .   Per test claim: PA required; PA submitted to above mentioned insurance via CoverMyMeds Key/confirmation #/EOC [id number] Status is pending

## 2024-01-27 ENCOUNTER — Other Ambulatory Visit (HOSPITAL_COMMUNITY): Payer: Self-pay

## 2024-01-28 ENCOUNTER — Other Ambulatory Visit: Payer: Self-pay

## 2024-01-28 NOTE — Telephone Encounter (Signed)
 Approved on June 1 by [facility] [employer] [facility] 2017 Approved. Effective Date: 01/24/2024 Authorization Expiration Date: 01/23/2025

## 2024-01-30 ENCOUNTER — Other Ambulatory Visit (HOSPITAL_COMMUNITY): Payer: Self-pay

## 2024-01-30 ENCOUNTER — Telehealth: Payer: Self-pay | Admitting: Pharmacy Technician

## 2024-01-30 NOTE — Telephone Encounter (Signed)
 Pharmacy Patient Advocate Encounter   Received notification from STAFF MESSAGE that prior authorization for DIMETHYL FUMARATE  STARTER PACK is required/requested.   Insurance verification completed.   The patient is insured through [facility] .   Per test claim: PA required; PA submitted to above mentioned insurance via Fax Key/confirmation #/EOC [id number] Status is pending

## 2024-01-30 NOTE — Telephone Encounter (Signed)
-----   Message from CMA Mahina A sent at 01/21/2024  2:50 PM EDT ----- Patient will be started on Dimethyl Fumarate . PA will be needed.   Thank you

## 2024-01-31 NOTE — Telephone Encounter (Signed)
 Called patient and informed her that her PA has been approved for her Dimethyl fumerate and she may contact her pharmacy for fill. Left contact information incase patient had any questions or concerns.

## 2024-02-11 ENCOUNTER — Other Ambulatory Visit: Payer: Self-pay

## 2024-02-11 ENCOUNTER — Other Ambulatory Visit (HOSPITAL_COMMUNITY): Payer: Self-pay

## 2024-02-11 ENCOUNTER — Other Ambulatory Visit: Payer: Self-pay | Admitting: Neurology

## 2024-02-11 MED ORDER — DIMETHYL FUMARATE STARTER PACK 120 & 240 MG PO CDPK
DELAYED_RELEASE_CAPSULE | ORAL | 5 refills | Status: AC
  Filled 2024-02-13 – 2024-03-04 (×3): qty 60, 30d supply, fill #0

## 2024-02-12 ENCOUNTER — Other Ambulatory Visit: Payer: Self-pay

## 2024-02-13 ENCOUNTER — Other Ambulatory Visit: Payer: Self-pay

## 2024-02-13 ENCOUNTER — Other Ambulatory Visit: Payer: Self-pay | Admitting: Pharmacy Technician

## 2024-02-13 ENCOUNTER — Other Ambulatory Visit (HOSPITAL_COMMUNITY): Payer: Self-pay

## 2024-02-13 NOTE — Progress Notes (Signed)
 Ready for RPh to call

## 2024-02-13 NOTE — Progress Notes (Signed)
 Specialty Pharmacy Initiation Note   Jacqueline Erickson is a 33 y.o. female who will be followed by the specialty pharmacy service for RxSp Multiple Sclerosis    Review of administration, indication, effectiveness, safety, potential side effects, storage/disposable, and missed dose instructions occurred today for patient's specialty medication(s) Dimethyl Fumarate      Patient/Caregiver did not have any additional questions or concerns.   Patient's therapy is appropriate to: Initiate    Goals Addressed             This Visit's Progress    Stabilization of disease       Patient is initiating therapy. Patient will maintain adherence         Malachi Screws Specialty Pharmacist

## 2024-02-13 NOTE — Progress Notes (Signed)
 Specialty Pharmacy Initial Fill Coordination Note  Jacqueline Erickson is a 33 y.o. female contacted today regarding initial fill of specialty medication(s) Dimethyl Fumarate    Patient requested [place] [facility] at [facility] at [place] date: 02/14/24   Medication will be filled on 02/14/24.   Patient is aware of $20 copayment.

## 2024-02-14 ENCOUNTER — Other Ambulatory Visit: Payer: Self-pay

## 2024-02-18 ENCOUNTER — Other Ambulatory Visit: Payer: Self-pay

## 2024-02-19 ENCOUNTER — Encounter: Payer: Self-pay | Admitting: Neurology

## 2024-02-21 ENCOUNTER — Other Ambulatory Visit: Payer: Self-pay

## 2024-02-23 ENCOUNTER — Other Ambulatory Visit

## 2024-02-26 ENCOUNTER — Other Ambulatory Visit (HOSPITAL_COMMUNITY): Payer: Self-pay

## 2024-02-27 ENCOUNTER — Other Ambulatory Visit: Payer: Self-pay

## 2024-03-02 ENCOUNTER — Other Ambulatory Visit (HOSPITAL_COMMUNITY): Payer: Self-pay

## 2024-03-03 ENCOUNTER — Other Ambulatory Visit (HOSPITAL_COMMUNITY): Payer: Self-pay

## 2024-03-03 ENCOUNTER — Other Ambulatory Visit: Payer: Self-pay

## 2024-03-04 ENCOUNTER — Other Ambulatory Visit (HOSPITAL_COMMUNITY): Payer: Self-pay

## 2024-03-04 ENCOUNTER — Other Ambulatory Visit: Payer: Self-pay

## 2024-03-04 NOTE — Progress Notes (Signed)
 Specialty Pharmacy Refill Coordination Note  Jacqueline Erickson is a 33 y.o. female contacted today regarding refills of specialty medication(s) Dimethyl Fumarate    Patient requested [name] at [facility] at [place] date: 03/05/24   Medication will be filled on 03/04/24.

## 2024-03-06 ENCOUNTER — Other Ambulatory Visit: Payer: Self-pay

## 2024-03-06 ENCOUNTER — Other Ambulatory Visit (HOSPITAL_COMMUNITY): Payer: Self-pay

## 2024-03-11 ENCOUNTER — Other Ambulatory Visit: Payer: Self-pay

## 2024-03-13 ENCOUNTER — Other Ambulatory Visit: Payer: Self-pay

## 2024-03-16 ENCOUNTER — Other Ambulatory Visit (HOSPITAL_COMMUNITY): Payer: Self-pay

## 2024-03-20 NOTE — Telephone Encounter (Signed)
 Approved on June 1 by [facility] [employer] [facility] 2017 Approved. Effective Date: 01/24/2024 Authorization Expiration Date: 01/23/2025

## 2024-04-07 ENCOUNTER — Other Ambulatory Visit: Payer: Self-pay

## 2024-04-08 NOTE — Telephone Encounter (Signed)
Called patient and unable to leave a message as mailbox is full.  ?

## 2024-04-09 NOTE — Telephone Encounter (Signed)
 Called patient and unable to leave a message as mailbox not set up. Will send Mychart message.

## 2024-04-10 ENCOUNTER — Other Ambulatory Visit: Payer: Self-pay

## 2024-04-10 NOTE — Progress Notes (Signed)
 Patient last 2 attempts to fill for initial Rx have been returned to stock as they were not picked up. [place] reaching out to patient/provider to discuss if she wishes to proceed in taking this medication.

## 2024-04-22 NOTE — Progress Notes (Signed)
 Attempt has been made to patient by office and mychart message to see if patient is still taking medication. No response from patient in two weeks. Will dis enroll patient from services until patient reaches out to reestablish medication orders.

## 2024-06-15 ENCOUNTER — Ambulatory Visit
Admission: EM | Admit: 2024-06-15 | Discharge: 2024-06-15 | Disposition: A | Discharge status: Home / Self Care | Attending: Family Medicine | Admitting: Family Medicine

## 2024-06-15 DIAGNOSIS — B9789 Other viral agents as the cause of diseases classified elsewhere: Secondary | ICD-10-CM

## 2024-06-15 DIAGNOSIS — J988 Other specified respiratory disorders: Secondary | ICD-10-CM | POA: Diagnosis not present

## 2024-06-15 MED ORDER — IBUPROFEN 600 MG PO TABS: 600.0000 mg | ORAL_TABLET | Freq: Four times a day (QID) | ORAL | 0 refills | Status: AC | PRN

## 2024-06-15 MED ORDER — PSEUDOEPHEDRINE HCL 30 MG PO TABS: 30.0000 mg | ORAL_TABLET | Freq: Three times a day (TID) | ORAL | 0 refills | Status: AC | PRN

## 2024-06-15 MED ORDER — PROMETHAZINE-DM 6.25-15 MG/5ML PO SYRP: 5.0000 mL | ORAL_SOLUTION | Freq: Three times a day (TID) | ORAL | 0 refills | Status: DC | PRN

## 2024-06-15 MED ORDER — CETIRIZINE HCL 10 MG PO TABS: 10.0000 mg | ORAL_TABLET | Freq: Every day | ORAL | 0 refills | Status: AC
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
## 2024-06-15 NOTE — Discharge Instructions (Signed)
 We will manage this as a viral respiratory illness. For sore throat or cough try using a honey-based tea. Use 3 teaspoons of honey with juice squeezed from half lemon. Place shaved pieces of ginger into 1/2-1 cup of water and warm over stove top. Then mix the ingredients and repeat every 4 hours as needed. Please take ibuprofen  600mg  every 6 hours with food alternating with OR taken together with Tylenol  500mg -650mg  every 6 hours for throat pain, fevers, aches and pains. Hydrate very well with at least 2 liters of water. Eat light meals such as soups (chicken and noodles, vegetable, chicken and wild rice).  Do not eat foods that you are allergic to.  Taking an antihistamine like Zyrtec  (10mg  daily) can help against postnasal drainage, sinus congestion which can cause sinus pain, sinus headaches, throat pain, painful swallowing, coughing.  You can take this together with pseudoephedrine  (Sudafed) at a dose of 30mg  3 times a day or twice daily as needed for the same kind of nasal drip, congestion.

## 2024-06-15 NOTE — ED Triage Notes (Signed)
 Pt c/o cough, nasal congestion, body aches, sore throat-sx started 4-5 days ago-taking alka seltzer-NAD-steady gait

## 2024-08-03 ENCOUNTER — Ambulatory Visit: Admitting: Neurology

## 2024-09-29 ENCOUNTER — Ambulatory Visit: Admitting: Adult Health

## 2024-10-02 ENCOUNTER — Ambulatory Visit
Admission: RE | Admit: 2024-10-02 | Discharge: 2024-10-02 | Disposition: A | Discharge status: Home / Self Care | Source: Ambulatory Visit

## 2024-10-02 VITALS — BP 108/81 | HR 84 | Temp 98.1°F | Resp 16

## 2024-10-02 DIAGNOSIS — J40 Bronchitis, not specified as acute or chronic: Secondary | ICD-10-CM

## 2024-10-02 DIAGNOSIS — J329 Chronic sinusitis, unspecified: Secondary | ICD-10-CM

## 2024-10-02 MED ORDER — PROMETHAZINE-DM 6.25-15 MG/5ML PO SYRP: 5.0000 mL | ORAL_SOLUTION | Freq: Four times a day (QID) | ORAL | 0 refills | Status: AC | PRN

## 2024-10-02 MED ORDER — DOXYCYCLINE HYCLATE 100 MG PO CAPS: 100.0000 mg | ORAL_CAPSULE | Freq: Two times a day (BID) | ORAL | 0 refills | Status: AC

## 2024-10-02 NOTE — ED Triage Notes (Signed)
 Pt present with green mucus, sore throat, productive cough x 1 week.  States she had headaches. Home interventions: Alka Seltzer gel capsules.
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
## 2024-10-02 NOTE — Discharge Instructions (Signed)
 Start doxycycline  antibiotic twice daily for 7 days.  You may take Promethazine  DM as needed for your cough.  Please of this medication can make you drowsy.  Do not drink alcohol or drive while on this medication.  Lots of rest and fluids and follow-up with your PCP in 2 to 3 days for recheck.  Please go to the ER for any worsening symptoms.  Hope you feel better soon!
